# Patient Record
Sex: Male | Born: 1995 | Race: Black or African American | Hispanic: No | Marital: Single | Smoking: Current every day smoker
Health system: Southern US, Community
[De-identification: ages and names within clinical notes are randomized; demographics above are authoritative.]

## PROBLEM LIST (undated history)

## (undated) DIAGNOSIS — A749 Chlamydial infection, unspecified: Secondary | ICD-10-CM

---
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
## 2015-01-21 ENCOUNTER — Encounter (HOSPITAL_COMMUNITY): Payer: Self-pay | Admitting: Emergency Medicine

## 2015-01-21 ENCOUNTER — Emergency Department (HOSPITAL_COMMUNITY)
Admission: EM | Admit: 2015-01-21 | Discharge: 2015-01-21 | Disposition: A | Discharge status: Home / Self Care | Payer: Medicaid Other | Attending: Emergency Medicine | Admitting: Emergency Medicine

## 2015-01-21 DIAGNOSIS — Z720 Tobacco use: Secondary | ICD-10-CM | POA: Diagnosis not present

## 2015-01-21 DIAGNOSIS — Z23 Encounter for immunization: Secondary | ICD-10-CM | POA: Diagnosis not present

## 2015-01-21 DIAGNOSIS — S61236A Puncture wound without foreign body of right little finger without damage to nail, initial encounter: Secondary | ICD-10-CM | POA: Diagnosis present

## 2015-01-21 DIAGNOSIS — T148XXA Other injury of unspecified body region, initial encounter: Secondary | ICD-10-CM

## 2015-01-21 DIAGNOSIS — X58XXXA Exposure to other specified factors, initial encounter: Secondary | ICD-10-CM | POA: Diagnosis not present

## 2015-01-21 DIAGNOSIS — Y9289 Other specified places as the place of occurrence of the external cause: Secondary | ICD-10-CM | POA: Diagnosis not present

## 2015-01-21 DIAGNOSIS — Y9389 Activity, other specified: Secondary | ICD-10-CM | POA: Diagnosis not present

## 2015-01-21 DIAGNOSIS — Z8619 Personal history of other infectious and parasitic diseases: Secondary | ICD-10-CM | POA: Insufficient documentation

## 2015-01-21 DIAGNOSIS — Y998 Other external cause status: Secondary | ICD-10-CM | POA: Insufficient documentation

## 2015-01-21 MED ORDER — NAPROXEN 500 MG PO TABS: 500.0000 mg | ORAL_TABLET | Freq: Two times a day (BID) | ORAL | Status: DC

## 2015-01-21 MED ORDER — SULFAMETHOXAZOLE-TRIMETHOPRIM 800-160 MG PO TABS
1.0000 | ORAL_TABLET | Freq: Once | ORAL | Status: AC
  Administered 2015-01-21: 1 via ORAL
  Filled 2015-01-21: qty 1

## 2015-01-21 MED ORDER — IBUPROFEN 800 MG PO TABS
800.0000 mg | ORAL_TABLET | Freq: Once | ORAL | Status: AC
  Administered 2015-01-21: 800 mg via ORAL
  Filled 2015-01-21: qty 1

## 2015-01-21 MED ORDER — SULFAMETHOXAZOLE-TRIMETHOPRIM 800-160 MG PO TABS: 1.0000 | ORAL_TABLET | Freq: Two times a day (BID) | ORAL | Status: AC

## 2015-01-21 MED ORDER — TETANUS-DIPHTH-ACELL PERTUSSIS 5-2.5-18.5 LF-MCG/0.5 IM SUSP
0.5000 mL | Freq: Once | INTRAMUSCULAR | Status: AC
  Administered 2015-01-21: 0.5 mL via INTRAMUSCULAR
  Filled 2015-01-21: qty 0.5

## 2015-01-21 NOTE — ED Notes (Signed)
Pt. reports insect bite at right distal 5th finger 2 days ago with pain and mild swelling.

## 2015-01-21 NOTE — ED Provider Notes (Signed)
CSN: [id number]     Arrival date & time 01/21/15  0209 History   First MD Initiated Contact with Patient 01/21/15 [phone]     Chief Complaint  Patient presents with  . Insect Bite     (Consider location/radiation/quality/duration/timing/severity/associated sxs/prior Treatment) Patient is a 19 y.o. male presenting with hand pain. The history is provided by the patient.  Hand Pain This is a new problem. The current episode started more than 2 days ago. The problem occurs constantly. The problem has not changed since onset.Pertinent negatives include no chest pain, no abdominal pain, no headaches and no shortness of breath. Nothing aggravates the symptoms. Nothing relieves the symptoms. He has tried nothing for the symptoms. The treatment provided no relief.  Thought it was a spider bite to the right pinky finger as slight red with what looks like a puncture.  No f/c/r.    Past Medical History  Diagnosis Date  . Chlamydia    History reviewed. No pertinent past surgical history. History reviewed. No pertinent family history. History  Substance Use Topics  . Smoking status: Current Every Day Smoker    Types: Cigars  . Smokeless tobacco: Not on file  . Alcohol Use: Yes     Comment: occassional    Review of Systems  Constitutional: Negative for fever.  Respiratory: Negative for shortness of breath.   Cardiovascular: Negative for chest pain.  Gastrointestinal: Negative for abdominal pain.  Neurological: Negative for headaches.  All other systems reviewed and are negative.     Allergies  Review of patient's allergies indicates no known allergies.  Home Medications   Prior to Admission medications   Medication Sig Start Date End Date Taking? Authorizing Provider  HYDROcodone-acetaminophen (NORCO/VICODIN) 5-325 MG per tablet Take 2 tablets by mouth every 4 (four) hours as needed. 06/17/13   Marisa Severin, MD  ibuprofen (ADVIL,MOTRIN) 600 MG tablet Take 1 tablet (600 mg total) by mouth  every 6 (six) hours as needed for pain. 10/04/12   Renne Crigler, PA-C   BP 120/80 mmHg  Pulse 57  Temp(Src) 98.2 F (36.8 C) (Oral)  Resp 16  SpO2 100% Physical Exam  Constitutional: He is oriented to person, place, and time. He appears well-developed and well-nourished. No distress.  HENT:  Head: Normocephalic and atraumatic.  Mouth/Throat: Oropharynx is clear and moist.  Eyes: Conjunctivae and EOM are normal. Pupils are equal, round, and reactive to light.  Neck: Normal range of motion. Neck supple.  Cardiovascular: Normal rate and regular rhythm.   Pulmonary/Chest: Effort normal and breath sounds normal. No respiratory distress. He has no wheezes. He has no rales.  Abdominal: Soft. Bowel sounds are normal. There is no tenderness. There is no rebound and no guarding.  Musculoskeletal: Normal range of motion. He exhibits no edema.       Right hand: He exhibits normal range of motion, no bony tenderness, normal capillary refill and no swelling. Normal sensation noted. Normal strength noted.       Hands: Neurological: He is alert and oriented to person, place, and time. He has normal reflexes.  Skin: Skin is warm and dry.  Psychiatric: He has a normal mood and affect.    ED Course  Procedures (including critical care time) Labs Review Labs Reviewed - No data to display  Imaging Review No results found.   EKG Interpretation None      MDM   Final diagnoses:  None   Area cleansed and dressed.  Works around [employer] and metal suspect  puncture than is mildly inflamed.  Will update tetanus, start bactrim and NSAIDS.  Follow up with your PMD.  Return for swelling streaking or any concerns    Nathaniel Henton, MD 01/21/15 0222

## 2016-09-13 ENCOUNTER — Encounter (HOSPITAL_COMMUNITY): Payer: Self-pay | Admitting: Emergency Medicine

## 2016-09-13 ENCOUNTER — Emergency Department (HOSPITAL_COMMUNITY)
Admission: EM | Admit: 2016-09-13 | Discharge: 2016-09-13 | Disposition: A | Discharge status: Home / Self Care | Payer: Medicaid Other | Attending: Emergency Medicine | Admitting: Emergency Medicine

## 2016-09-13 DIAGNOSIS — F17290 Nicotine dependence, other tobacco product, uncomplicated: Secondary | ICD-10-CM | POA: Insufficient documentation

## 2016-09-13 DIAGNOSIS — L0291 Cutaneous abscess, unspecified: Secondary | ICD-10-CM

## 2016-09-13 DIAGNOSIS — L02214 Cutaneous abscess of groin: Secondary | ICD-10-CM | POA: Insufficient documentation

## 2016-09-13 MED ORDER — NAPROXEN 500 MG PO TABS: 500.0000 mg | ORAL_TABLET | Freq: Two times a day (BID) | ORAL | 0 refills | Status: DC

## 2016-09-13 MED ORDER — DOXYCYCLINE HYCLATE 100 MG PO CAPS: 100.0000 mg | ORAL_CAPSULE | Freq: Two times a day (BID) | ORAL | 0 refills | Status: DC

## 2016-09-13 NOTE — ED Provider Notes (Signed)
[facility] Provider Note    By signing my name below, I, Earmon Phoenix, attest that this documentation has been prepared under the direction and in the presence of [facility], [place]. Electronically Signed: Earmon Phoenix, ED Scribe. 09/13/16. 4:52 PM.   History   Chief Complaint Chief Complaint  Patient presents with  . Abscess    The history is provided by the patient and medical records. No language interpreter was used.    Nathaniel Peterson is a 21 y.o. male who presents to the Emergency Department complaining of a draining abscess to the upper right medial thigh in the groin area that appeared yesterday. He reports associated drainage that began right before coming here. He has not taken anything for pain relief. Palpating the area increases the pain. He denies alleviating factors. He denies fever, chills, nausea, vomiting, warmth or red streaking of the area, scrotal pain.   Past Medical History:  Diagnosis Date  . Chlamydia     There are no active problems to display for this patient.   History reviewed. No pertinent surgical history.     Home Medications    Prior to Admission medications   Medication Sig Start Date End Date Taking? Authorizing Provider  doxycycline (VIBRAMYCIN) 100 MG capsule Take 1 capsule (100 mg total) by mouth 2 (two) times daily. 09/13/16   Hope Orlene Och, NP  naproxen (NAPROSYN) 500 MG tablet Take 1 tablet (500 mg total) by mouth 2 (two) times daily. 09/13/16   Hope Orlene Och, NP    Family History History reviewed. No pertinent family history.  Social History Social History  Substance Use Topics  . Smoking status: Current Every Day Smoker    Types: Cigars  . Smokeless tobacco: Not on file  . Alcohol use Yes     Comment: occassional     Allergies   Patient has no known allergies.   Review of Systems Review of Systems  Constitutional: Negative for chills and fever.  Gastrointestinal: Negative for abdominal pain, nausea  and vomiting.  Genitourinary: Negative for difficulty urinating and dysuria.  Skin: Positive for color change (abscess to upper right groin) and wound.     Physical Exam Updated Vital Signs BP 131/67 (BP Location: Right Arm)   Pulse 71   Temp 98.5 F (36.9 C) (Oral)   Resp 18   SpO2 100%   Physical Exam  Constitutional: He appears well-developed and well-nourished. No distress.  HENT:  Head: Normocephalic.  Eyes: EOM are normal.  Neck: Neck supple.  Cardiovascular: Normal rate.   Pulmonary/Chest: Effort normal.  Abdominal: Soft. There is no tenderness.  Musculoskeletal: Normal range of motion.  Neurological: He is alert.  Skin: Skin is warm and dry.  Draining abscess to the right pubic area beside scrotum. Copious amount of purulent drainage noted. Tender on exam.  Psychiatric: He has a normal mood and affect. His behavior is normal.  Nursing note and vitals reviewed.    ED Treatments / Results  DIAGNOSTIC STUDIES: Oxygen Saturation is 100% on RA, normal by my interpretation.   COORDINATION OF CARE: 4:33 PM- Advised pt that an incision and drainage could be performed to make a larger area for drainage or antibiotics could be prescribed and he could apply warm wet compresses to assist with drainage. Pt requested antibiotics and declined I & D. Pt verbalizes understanding and agrees to plan.  Medications - No data to display  Radiology No results found.  Procedures Procedures (including critical care time)  Medications Ordered  in ED Medications - No data to display   Initial Impression / Assessment and Plan / ED Course  I have reviewed the triage vital signs and the nursing notes.  Patient with skin abscess. Incision and drainage was not performed in the ED today due to area already draining and  pt declining procedure. Will prescribe Doxycycline. Supportive care and return precautions discussed. The patient appears reasonably screened and/or stabilized for  discharge and I doubt any other emergent medical condition requiring further screening, evaluation, or treatment in the ED prior to discharge.  I personally performed the services described in this documentation, which was scribed in my presence. The recorded information has been reviewed and is accurate.   Final Clinical Impressions(s) / ED Diagnoses   Final diagnoses:  Abscess    New Prescriptions Discharge Medication List as of 09/13/2016  4:38 PM    START taking these medications   Details  doxycycline (VIBRAMYCIN) 100 MG capsule Take 1 capsule (100 mg total) by mouth 2 (two) times daily., Starting Tue 09/13/2016, [address]         Hope [place] AcresM Neese, NP 09/17/16 [id number]    Alvira MondayErin Schlossman, MD 09/19/16 [phone]

## 2016-09-13 NOTE — ED Notes (Signed)
Pt state she understands instructioons. Home stable with steady gait.

## 2016-09-13 NOTE — ED Triage Notes (Signed)
Pt sts abscess to upper right leg that is draining at present

## 2016-09-13 NOTE — Discharge Instructions (Signed)
Take the medication as directed. Apply warm wet compress to the area or sit in warm tubs of water. Return if symptoms worsen.

## 2016-12-25 ENCOUNTER — Encounter (HOSPITAL_COMMUNITY): Payer: Self-pay | Admitting: Emergency Medicine

## 2016-12-25 ENCOUNTER — Ambulatory Visit (HOSPITAL_COMMUNITY)
Admission: EM | Admit: 2016-12-25 | Discharge: 2016-12-25 | Disposition: A | Discharge status: Home / Self Care | Payer: Medicaid Other | Attending: Family Medicine | Admitting: Family Medicine

## 2016-12-25 DIAGNOSIS — H6002 Abscess of left external ear: Secondary | ICD-10-CM

## 2016-12-25 MED ORDER — DOXYCYCLINE HYCLATE 100 MG PO TABS: 100.0000 mg | ORAL_TABLET | Freq: Two times a day (BID) | ORAL | 0 refills | Status: DC

## 2016-12-25 MED ORDER — HYDROCODONE-ACETAMINOPHEN 5-325 MG PO TABS: 1.0000 | ORAL_TABLET | Freq: Four times a day (QID) | ORAL | 0 refills | Status: DC | PRN

## 2016-12-25 NOTE — ED Triage Notes (Signed)
The patient presented to the [facility] with a complaint of left side ear pain x 2 days. 

## 2016-12-25 NOTE — ED Provider Notes (Signed)
[facility]    CSN: [id number] Arrival date & time: 12/25/16  1728     History   Chief Complaint Chief Complaint  Patient presents with  . Otalgia    HPI Nathaniel Peterson is a 21 y.o. male.   The patient presented to the [facility] with a complaint of left side ear pain x 2 days.  He's noted a chronic swelling behind the lobe of the left ear following a piercing years ago.      Past Medical History:  Diagnosis Date  . Chlamydia     There are no active problems to display for this patient.   History reviewed. No pertinent surgical history.     Home Medications    Prior to Admission medications   Medication Sig Start Date End Date Taking? Authorizing Provider  doxycycline (VIBRA-TABS) 100 MG tablet Take 1 tablet (100 mg total) by mouth 2 (two) times daily. 12/25/16   Elvina Sidle, MD  HYDROcodone-acetaminophen (NORCO) 5-325 MG tablet Take 1 tablet by mouth every 6 (six) hours as needed for moderate pain. 12/25/16   Elvina Sidle, MD    Family History History reviewed. No pertinent family history.  Social History Social History  Substance Use Topics  . Smoking status: Current Every Day Smoker    Types: Cigars  . Smokeless tobacco: Not on file  . Alcohol use Yes     Comment: occassional     Allergies   Patient has no known allergies.   Review of Systems Review of Systems  HENT: Positive for ear pain.   All other systems reviewed and are negative.    Physical Exam Triage Vital Signs ED Triage Vitals [12/25/16 1752]  Enc Vitals Group     BP (!) 131/59     Pulse Rate (!) 55     Resp 16     Temp 98.4 F (36.9 C)     Temp Source Oral     SpO2 100 %     Weight      Height      Head Circumference      Peak Flow      Pain Score 7     Pain Loc      Pain Edu?      Excl. in GC?    No data found.   Updated Vital Signs BP (!) 131/59 (BP Location: Right Arm)   Pulse (!) 55   Temp 98.4 F (36.9 C) (Oral)   Resp 16   SpO2 100%    Visual Acuity Right Eye Distance:   Left Eye Distance:   Bilateral Distance:    Right Eye Near:   Left Eye Near:    Bilateral Near:     Physical Exam  Constitutional: He is oriented to person, place, and time. He appears well-developed and well-nourished.  HENT:  Left external ear has a 1 cm abscess on the posterior aspect of the lobe.  Eyes: Conjunctivae are normal. Pupils are equal, round, and reactive to light.  Neck: Normal range of motion. Neck supple.  Pulmonary/Chest: Effort normal.  Musculoskeletal: Normal range of motion.  Neurological: He is alert and oriented to person, place, and time.  Skin: Skin is warm and dry.  Nursing note and vitals reviewed.    UC Treatments / Results  Labs (all labs ordered are listed, but only abnormal results are displayed) Labs Reviewed - No data to display  EKG  EKG Interpretation None       Radiology No  results found.  Procedures .Marland Kitchen.Incision and Drainage Date/Time: 12/25/2016 7:12 PM Performed by: Elvina SidleLAUENSTEIN, Davanta Meuser Authorized by: Elvina SidleLAUENSTEIN, Taiki Buckwalter   Consent:    Consent obtained:  Verbal   Consent given by:  Patient   Risks discussed:  Incomplete drainage   Alternatives discussed:  No treatment Location:    Type:  Abscess   Location:  Head   Head location:  L external ear Pre-procedure details:    Skin preparation:  Betadine Anesthesia (see MAR for exact dosages):    Anesthesia method:  Local infiltration   Local anesthetic:  Lidocaine 2% w/o epi Procedure type:    Complexity:  Simple Procedure details:    Needle aspiration: no     Incision types:  Stab incision   Incision depth:  Dermal   Scalpel blade:  15   Wound management:  Probed and deloculated   Drainage:  Purulent   Drainage amount:  Copious   Wound treatment:  Wound left open   Packing materials:  None Post-procedure details:    Patient tolerance of procedure:  Tolerated well, no immediate complications   (including critical care  time)  Medications Ordered in UC Medications - No data to display   Initial Impression / Assessment and Plan / UC Course  I have reviewed the triage vital signs and the nursing notes.  Pertinent labs & imaging results that were available during my care of the patient were reviewed by me and considered in my medical decision making (see chart for details).     Final Clinical Impressions(s) / UC Diagnoses   Final diagnoses:  Abscess of left earlobe    New Prescriptions New Prescriptions   DOXYCYCLINE (VIBRA-TABS) 100 MG TABLET    Take 1 tablet (100 mg total) by mouth 2 (two) times daily.   HYDROCODONE-ACETAMINOPHEN (NORCO) 5-325 MG TABLET    Take 1 tablet by mouth every 6 (six) hours as needed for moderate pain.     Elvina SidleLauenstein, Antoine Vandermeulen, MD 12/25/16 [phone]

## 2017-06-20 ENCOUNTER — Other Ambulatory Visit: Payer: Self-pay

## 2017-06-20 ENCOUNTER — Encounter (HOSPITAL_COMMUNITY): Payer: Self-pay | Admitting: Emergency Medicine

## 2017-06-20 DIAGNOSIS — H6001 Abscess of right external ear: Secondary | ICD-10-CM | POA: Insufficient documentation

## 2017-06-20 DIAGNOSIS — F17210 Nicotine dependence, cigarettes, uncomplicated: Secondary | ICD-10-CM | POA: Insufficient documentation

## 2017-06-20 DIAGNOSIS — L02214 Cutaneous abscess of groin: Secondary | ICD-10-CM | POA: Insufficient documentation

## 2017-06-20 DIAGNOSIS — Z79899 Other long term (current) drug therapy: Secondary | ICD-10-CM | POA: Insufficient documentation

## 2017-06-20 NOTE — ED Triage Notes (Signed)
Pt presents to ED for a developing abscess to his left groin and also on his right ear lobe.  Pt states both have been swelling and becoming more painful for 2 days now.

## 2017-06-21 ENCOUNTER — Emergency Department (HOSPITAL_COMMUNITY)
Admission: EM | Admit: 2017-06-21 | Discharge: 2017-06-21 | Disposition: A | Discharge status: Home / Self Care | Payer: Self-pay | Attending: Emergency Medicine | Admitting: Emergency Medicine

## 2017-06-21 DIAGNOSIS — L0291 Cutaneous abscess, unspecified: Secondary | ICD-10-CM

## 2017-06-21 MED ORDER — BACITRACIN ZINC 500 UNIT/GM EX OINT
TOPICAL_OINTMENT | Freq: Once | CUTANEOUS | Status: AC
  Administered 2017-06-21: 06:00:00 via TOPICAL

## 2017-06-21 MED ORDER — LIDOCAINE-EPINEPHRINE (PF) 2 %-1:200000 IJ SOLN
20.0000 mL | Freq: Once | INTRAMUSCULAR | Status: AC
  Administered 2017-06-21: 20 mL
  Filled 2017-06-21: qty 20

## 2017-06-21 MED ORDER — CEPHALEXIN 500 MG PO CAPS: 500.0000 mg | ORAL_CAPSULE | Freq: Four times a day (QID) | ORAL | 0 refills | Status: DC

## 2017-06-21 MED ORDER — SULFAMETHOXAZOLE-TRIMETHOPRIM 800-160 MG PO TABS: 1.0000 | ORAL_TABLET | Freq: Two times a day (BID) | ORAL | 0 refills | Status: AC

## 2017-06-21 NOTE — Discharge Instructions (Signed)
Please take all of your antibiotics until finished!  Ibuprofen and/or Tyelnol as needed for pain.  Follow up with your doctor, an urgent care, or return to ED in two days for wound check. Return to the emergency department if you develop a fever, new or worsening symptoms develop, any additional concerns.   Abscess An abscess (boil or furuncle) is an infected area that contains a collection of pus.   SYMPTOMS Signs and symptoms of an abscess include pain, tenderness, redness, or hardness. You may feel a moveable soft area under your skin. An abscess can occur anywhere in the body.   TREATMENT  A surgical cut (incision) may be made over your abscess to drain the pus. Gauze may be packed into the space or a drain may be looped through the abscess cavity (pocket). This provides a drain that will allow the cavity to heal from the inside outwards. The abscess may be painful for a few days, but should feel much better if it was drained.  Your abscess, if seen early, may not have localized and may not have been drained. If not, another appointment may be required if it does not get better on its own or with medications.  HOME CARE INSTRUCTIONS  Keep the skin and clothes clean around your abscess.  If the abscess was drained, you will need to use gauze dressing to collect any draining pus. Dressings will typically need to be changed 3 or more times a day.  The infection may spread by skin contact with others. Avoid skin contact as much as possible.  Practice good hygiene. This includes regular hand washing, cover any draining skin lesions, and do not share personal care items.  SEEK MEDICAL CARE IF:  You develop increased pain, swelling, redness, drainage, or bleeding in the wound site.  You develop signs of generalized infection including muscle aches, chills, fever, or a general ill feeling.  You have an oral temperature above 102 F (38.9 C).  MAKE SURE YOU:  Understand these instructions.    Will watch your condition.  Will get help right away if you are not doing well or get worse.  Document Released: 04/27/2005 Document Revised: 03/30/2011 Document Reviewed: 02/19/2008 [facility] Patient Information 2012 [employer], [place].

## 2017-06-21 NOTE — ED Provider Notes (Signed)
[facility] EMERGENCY DEPARTMENT Provider Note   CSN: [id number] Arrival date & time: 06/20/17  2347     History   Chief Complaint Chief Complaint  Patient presents with  . Abscess    HPI Nathaniel Peterson is a 21 y.o. male.  The history is provided by the patient and medical records. No language interpreter was used.  Abscess   Nathaniel Peterson is a 21 y.o. male who presents to the Emergency Department complaining of possible abscess to the left groin and the right ear lobe. Patient states area of pain and redness to the groin began about two days ago and has been progressively worsening. He actually had an abscess to the same earlobe back in May of this year after getting ear pierced. He states that he bought a cheap ear ring and believes that could have been contributory. Ear lobe healed well, but now pain and swelling has returned to the same site. No drainage from either. No medications or treatments prior to arrival for symptoms. No fever, chills.  Past Medical History:  Diagnosis Date  . Chlamydia     There are no active problems to display for this patient.   History reviewed. No pertinent surgical history.     Home Medications    Prior to Admission medications   Medication Sig Start Date End Date Taking? Authorizing Provider  cephALEXin (KEFLEX) 500 MG capsule Take 1 capsule (500 mg total) by mouth 4 (four) times daily. 06/21/17   Ward, Chase Picket, PA-C  HYDROcodone-acetaminophen (NORCO) 5-325 MG tablet Take 1 tablet by mouth every 6 (six) hours as needed for moderate pain. Patient not taking: Reported on 06/21/2017 12/25/16   Elvina Sidle, MD  sulfamethoxazole-trimethoprim (BACTRIM DS,SEPTRA DS) 800-160 MG tablet Take 1 tablet by mouth 2 (two) times daily for 7 days. 06/21/17 06/28/17  Ward, Chase Picket, PA-C    Family History History reviewed. No pertinent family history.  Social History Social History   Tobacco Use  . Smoking  status: Current Every Day Smoker    Types: Cigars  . Smokeless tobacco: Never Used  Substance Use Topics  . Alcohol use: Yes    Comment: occassional  . Drug use: Not on file     Allergies   Patient has no known allergies.   Review of Systems Review of Systems  Skin: Positive for wound.  All other systems reviewed and are negative.    Physical Exam Updated Vital Signs BP 138/75 (BP Location: Right Arm)   Pulse (!) 57   Temp 98.9 F (37.2 C) (Oral)   Resp 12   Ht 5\' 11"  (1.803 m)   Wt 81.6 kg (180 lb)   SpO2 100%   BMI 25.10 kg/m   Physical Exam  Constitutional: He appears well-developed and well-nourished. No distress.  HENT:  Head: Normocephalic and atraumatic.  Neck: Neck supple.  Cardiovascular: Normal rate, regular rhythm and normal heart sounds.  No murmur heard. Pulmonary/Chest: Effort normal and breath sounds normal. No respiratory distress. He has no wheezes. He has no rales.  Musculoskeletal: Normal range of motion.  Neurological: He is alert.  Skin: Skin is warm and dry.  Left groin with 2x3 area of fluctuance and surrounding erythema. + left inguinal lymphadenopathy. Right ear lobe with 1cm area of fluctuance without erythema. Neither draining.  Nursing note and vitals reviewed.    ED Treatments / Results  Labs (all labs ordered are listed, but only abnormal results are displayed) Labs Reviewed - No data  to display  EKG  EKG Interpretation None       Radiology No results found.  Procedures .Marland Kitchen.Incision and Drainage Date/Time: 06/21/2017 6:20 AM Performed by: Ward, Chase PicketJaime Pilcher, PA-C Authorized by: Ward, Chase PicketJaime Pilcher, PA-C   Consent:    Consent obtained:  Verbal   Consent given by:  Patient   Risks discussed:  Bleeding, incomplete drainage, pain and infection Location:    Type:  Abscess   Location:  Lower extremity   Lower extremity location: Left groin. Pre-procedure details:    Skin preparation:  Betadine Anesthesia (see MAR  for exact dosages):    Anesthesia method:  Local infiltration   Local anesthetic:  Lidocaine 2% WITH epi (2 ml) Procedure type:    Complexity:  Complex Procedure details:    Incision types:  Single straight   Scalpel blade:  11   Wound management:  Probed and deloculated   Drainage:  Purulent   Drainage amount:  Copious   Wound treatment:  Wound left open   Packing materials:  None Post-procedure details:    Patient tolerance of procedure:  Tolerated well, no immediate complications .Marland Kitchen.Incision and Drainage Date/Time: 06/21/2017 6:21 AM Performed by: Ward, Chase PicketJaime Pilcher, PA-C Authorized by: Ward, Chase PicketJaime Pilcher, PA-C   Consent:    Consent obtained:  Verbal   Consent given by:  Patient   Risks discussed:  Bleeding, incomplete drainage, infection and pain Location:    Type:  Abscess   Location:  Head   Head location:  R external ear Pre-procedure details:    Skin preparation:  Betadine Anesthesia (see MAR for exact dosages):    Anesthesia method:  Local infiltration   Local anesthetic:  Lidocaine 2% WITH epi Procedure type:    Complexity:  Simple Procedure details:    Incision types:  Stab incision   Scalpel blade:  11   Wound management:  Probed and deloculated   Drainage:  Purulent   Drainage amount:  Moderate   Wound treatment:  Wound left open Post-procedure details:    Patient tolerance of procedure:  Tolerated well, no immediate complications   (including critical care time)  EMERGENCY DEPARTMENT US SOFT TISSUE INTERPRETATION "Study: Limited Soft Tissue Ultrasound"  INDICATIONS: Soft tissue infection Multiple views of the body part were obtained in real-time with a multi-frequency linear probe  PERFORMED BY: Myself IMAGES ARCHIVED?: Yes SIDE:Left BODY PART:Groin INTERPRETATION:  Abcess present ; Surrounding inguinal lymphadenopathy.    Medications Ordered in ED Medications  lidocaine-EPINEPHrine (XYLOCAINE W/EPI) 2 %-1:200000 (PF) injection 20 mL (20  mLs Infiltration Given by Other 06/21/17 0513)  bacitracin ointment ( Topical Given 06/21/17 0543)     Initial Impression / Assessment and Plan / ED Course  I have reviewed the triage vital signs and the nursing notes.  Pertinent labs & imaging results that were available during my care of the patient were reviewed by me and considered in my medical decision making (see chart for details).     Ledora BottcherChristopher Blagg is a 21 y.o. male who presents to ED for abscess requiring incision and drainage of both right earlobe and left groin. I&D performed per procedure note above. Patient tolerated the procedure well. Groin with surrounding erythema and induration. Will treat with bactrim and keflex. Wound care instructions discussed. Wound check in 2-3 days. Return to ER if concern for spread of infection, increasing pain, fevers or other concerns. All questions answered.   Final Clinical Impressions(s) / ED Diagnoses   Final diagnoses:  Abscess  ED Discharge Orders        Ordered    sulfamethoxazole-trimethoprim (BACTRIM DS,SEPTRA DS) 800-160 MG tablet  2 times daily     06/21/17 0513    cephALEXin (KEFLEX) 500 MG capsule  4 times daily     06/21/17 0513       Ward, Chase PicketJaime Pilcher, PA-C 06/21/17 [id number]    Palumbo, April, MD 06/21/17 0710

## 2017-06-21 NOTE — ED Notes (Signed)
I&D tray and lidocaine at bedside 

## 2017-11-12 ENCOUNTER — Emergency Department (HOSPITAL_COMMUNITY)
Admission: EM | Admit: 2017-11-12 | Discharge: 2017-11-12 | Disposition: A | Discharge status: Home / Self Care | Payer: Self-pay | Attending: Emergency Medicine | Admitting: Emergency Medicine

## 2017-11-12 ENCOUNTER — Encounter (HOSPITAL_COMMUNITY): Payer: Self-pay | Admitting: Emergency Medicine

## 2017-11-12 DIAGNOSIS — F17290 Nicotine dependence, other tobacco product, uncomplicated: Secondary | ICD-10-CM | POA: Insufficient documentation

## 2017-11-12 DIAGNOSIS — N341 Nonspecific urethritis: Secondary | ICD-10-CM | POA: Insufficient documentation

## 2017-11-12 DIAGNOSIS — N342 Other urethritis: Secondary | ICD-10-CM

## 2017-11-12 LAB — URINALYSIS, ROUTINE W REFLEX MICROSCOPIC
Bacteria, UA: NONE SEEN
Bilirubin Urine: NEGATIVE
Glucose, UA: NEGATIVE mg/dL
Hgb urine dipstick: NEGATIVE
Ketones, ur: NEGATIVE mg/dL
NITRITE: NEGATIVE
PH: 5 (ref 5.0–8.0)
Protein, ur: NEGATIVE mg/dL
SPECIFIC GRAVITY, URINE: 1.027 (ref 1.005–1.030)
SQUAMOUS EPITHELIAL / LPF: NONE SEEN

## 2017-11-12 MED ORDER — LIDOCAINE HCL (PF) 1 % IJ SOLN
INTRAMUSCULAR | Status: AC
  Administered 2017-11-12: 2.1 mL
  Filled 2017-11-12: qty 5

## 2017-11-12 MED ORDER — METRONIDAZOLE 500 MG PO TABS
2000.0000 mg | ORAL_TABLET | Freq: Once | ORAL | Status: AC
  Administered 2017-11-12: 2000 mg via ORAL
  Filled 2017-11-12: qty 4

## 2017-11-12 MED ORDER — ONDANSETRON 4 MG PO TBDP
4.0000 mg | ORAL_TABLET | Freq: Once | ORAL | Status: AC
Start: 2017-11-12 — End: 2017-11-12
  Administered 2017-11-12: 4 mg via ORAL
  Filled 2017-11-12: qty 1

## 2017-11-12 MED ORDER — AZITHROMYCIN 250 MG PO TABS
1000.0000 mg | ORAL_TABLET | Freq: Once | ORAL | Status: AC
  Administered 2017-11-12: 1000 mg via ORAL
  Filled 2017-11-12: qty 4

## 2017-11-12 MED ORDER — CEFTRIAXONE SODIUM 250 MG IJ SOLR
250.0000 mg | Freq: Once | INTRAMUSCULAR | Status: AC
  Administered 2017-11-12: 250 mg via INTRAMUSCULAR
  Filled 2017-11-12: qty 250

## 2017-11-12 NOTE — ED Provider Notes (Signed)
[facility] Provider Note   CSN: [id number] Arrival date & time: 11/12/17  0010     History   Chief Complaint Chief Complaint  Patient presents with  . Exposure to STD    HPI2 Nathaniel Peterson is a 22 y.o. male.  22 yo M with a chief complaint of dysuria.  Going on for the past couple days ever since he had a sexual encounter and the condom broke.  Patient just before that was treated for trichomonas.  He denies any penile pain or testicular pain.  Denies rash or lesion.  Denies abdominal pain vomiting fevers or chills.  The history is provided by the patient.  Illness  This is a new problem. The current episode started 2 days ago. The problem occurs constantly. The problem has not changed since onset.Pertinent negatives include no chest pain, no abdominal pain, no headaches and no shortness of breath. Nothing aggravates the symptoms. Nothing relieves the symptoms. He has tried nothing for the symptoms. The treatment provided no relief.    Past Medical History:  Diagnosis Date  . Chlamydia     There are no active problems to display for this patient.   History reviewed. No pertinent surgical history.      Home Medications    Prior to Admission medications   Medication Sig Start Date End Date Taking? Authorizing Provider  cephALEXin (KEFLEX) 500 MG capsule Take 1 capsule (500 mg total) by mouth 4 (four) times daily. 06/21/17   Ward, Chase PicketJaime Pilcher, PA-C  HYDROcodone-acetaminophen (NORCO) 5-325 MG tablet Take 1 tablet by mouth every 6 (six) hours as needed for moderate pain. Patient not taking: Reported on 06/21/2017 12/25/16   Elvina SidleLauenstein, Kurt, MD    Family History No family history on file.  Social History Social History   Tobacco Use  . Smoking status: Current Every Day Smoker    Types: Cigars  . Smokeless tobacco: Never Used  Substance Use Topics  . Alcohol use: Yes    Comment: occassional  . Drug use: Yes    Types:  Marijuana     Allergies   Patient has no known allergies.   Review of Systems Review of Systems  Constitutional: Negative for chills and fever.  HENT: Negative for congestion and facial swelling.   Eyes: Negative for discharge and visual disturbance.  Respiratory: Negative for shortness of breath.   Cardiovascular: Negative for chest pain and palpitations.  Gastrointestinal: Negative for abdominal pain, diarrhea and vomiting.  Genitourinary: Positive for dysuria.  Musculoskeletal: Negative for arthralgias and myalgias.  Skin: Negative for color change and rash.  Neurological: Negative for tremors, syncope and headaches.  Psychiatric/Behavioral: Negative for confusion and dysphoric mood.     Physical Exam Updated Vital Signs BP (!) 144/72   Pulse 69   Temp 98.3 F (36.8 C) (Oral)   Resp 18   SpO2 100%   Physical Exam  Constitutional: He is oriented to person, place, and time. He appears well-developed and well-nourished.  HENT:  Head: Normocephalic and atraumatic.  Eyes: Pupils are equal, round, and reactive to light. EOM are normal.  Neck: Normal range of motion. Neck supple. No JVD present.  Cardiovascular: Normal rate and regular rhythm. Exam reveals no gallop and no friction rub.  No murmur heard. Pulmonary/Chest: No respiratory distress. He has no wheezes.  Abdominal: He exhibits no distension. There is no rebound and no guarding.  Genitourinary: Testes normal. Cremasteric reflex is present. Right testis shows no mass and no  tenderness. Left testis shows no mass and no tenderness. Circumcised. Discharge (purulent) found.  Musculoskeletal: Normal range of motion.  Neurological: He is alert and oriented to person, place, and time.  Skin: No rash noted. No pallor.  Psychiatric: He has a normal mood and affect. His behavior is normal.  Nursing note and vitals reviewed.    ED Treatments / Results  Labs (all labs ordered are listed, but only abnormal results are  displayed) Labs Reviewed  URINALYSIS, ROUTINE W REFLEX MICROSCOPIC - Abnormal; Notable for the following components:      Result Value   APPearance HAZY (*)    Leukocytes, UA LARGE (*)    All other components within normal limits  GC/CHLAMYDIA PROBE AMP ([place]) NOT AT [facility]    EKG None  Radiology No results found.  Procedures Procedures (including critical care time)  Medications Ordered in ED Medications  cefTRIAXone (ROCEPHIN) injection 250 mg (has no administration in time range)  azithromycin (ZITHROMAX) tablet 1,000 mg (has no administration in time range)  metroNIDAZOLE (FLAGYL) tablet 2,000 mg (has no administration in time range)  ondansetron (ZOFRAN-ODT) disintegrating tablet 4 mg (has no administration in time range)     Initial Impression / Assessment and Plan / ED Course  I have reviewed the triage vital signs and the nursing notes.  Pertinent labs & imaging results that were available during my care of the patient were reviewed by me and considered in my medical decision making (see chart for details).     22 yo M with concern for STD, purulent discharge from penis, will treat.  Of note the patient declined testing for HIV or syphilis.  3:27 AM:  I have discussed the diagnosis/risks/treatment options with the patient and believe the pt to be eligible for discharge home to follow-up with PCP. We also discussed returning to the ED immediately if new or worsening sx occur. We discussed the sx which are most concerning (e.g., sudden worsening pain, fever, inability to tolerate by mouth) that necessitate immediate return. Medications administered to the patient during their visit and any new prescriptions provided to the patient are listed below.  Medications given during this visit Medications  cefTRIAXone (ROCEPHIN) injection 250 mg (250 mg Intramuscular Given 11/12/17 0306)  azithromycin (ZITHROMAX) tablet 1,000 mg (1,000 mg Oral Given 11/12/17 0301)    metroNIDAZOLE (FLAGYL) tablet 2,000 mg (2,000 mg Oral Given 11/12/17 0302)  ondansetron (ZOFRAN-ODT) disintegrating tablet 4 mg (4 mg Oral Given 11/12/17 0302)  lidocaine (PF) (XYLOCAINE) 1 % injection (2.1 mLs  Given 11/12/17 0306)     The patient appears reasonably screen and/or stabilized for discharge and I doubt any other medical condition or other [facility] requiring further screening, evaluation, or treatment in the ED at this time prior to discharge.    Final Clinical Impressions(s) / ED Diagnoses   Final diagnoses:  Urethritis    ED Discharge Orders    None       Melene Plan, DO 11/12/17 0327

## 2017-11-12 NOTE — ED Triage Notes (Signed)
Pt presents with c/o of dysuria, pt has recent hx of trich/ pt states he did have sex and condom broke. Denies drainage/ denies hematuria

## 2017-11-13 LAB — GC/CHLAMYDIA PROBE AMP (~~LOC~~) NOT AT ARMC
CHLAMYDIA, DNA PROBE: NEGATIVE
NEISSERIA GONORRHEA: POSITIVE — AB

## 2018-01-31 ENCOUNTER — Emergency Department (HOSPITAL_COMMUNITY)
Admission: EM | Admit: 2018-01-31 | Discharge: 2018-01-31 | Disposition: A | Discharge status: Home / Self Care | Payer: Self-pay | Attending: Emergency Medicine | Admitting: Emergency Medicine

## 2018-01-31 ENCOUNTER — Encounter (HOSPITAL_COMMUNITY): Payer: Self-pay

## 2018-01-31 ENCOUNTER — Other Ambulatory Visit: Payer: Self-pay

## 2018-01-31 ENCOUNTER — Emergency Department (HOSPITAL_COMMUNITY): Payer: Self-pay

## 2018-01-31 DIAGNOSIS — F17210 Nicotine dependence, cigarettes, uncomplicated: Secondary | ICD-10-CM | POA: Insufficient documentation

## 2018-01-31 DIAGNOSIS — R1033 Periumbilical pain: Secondary | ICD-10-CM | POA: Insufficient documentation

## 2018-01-31 LAB — URINALYSIS, ROUTINE W REFLEX MICROSCOPIC
Bilirubin Urine: NEGATIVE
Glucose, UA: NEGATIVE mg/dL
Hgb urine dipstick: NEGATIVE
KETONES UR: NEGATIVE mg/dL
Leukocytes, UA: NEGATIVE
NITRITE: NEGATIVE
PROTEIN: NEGATIVE mg/dL
Specific Gravity, Urine: 1.028 (ref 1.005–1.030)
pH: 8 (ref 5.0–8.0)

## 2018-01-31 LAB — COMPREHENSIVE METABOLIC PANEL
ALK PHOS: 75 U/L (ref 38–126)
ALT: 13 U/L (ref 0–44)
ANION GAP: 11 (ref 5–15)
AST: 24 U/L (ref 15–41)
Albumin: 4.2 g/dL (ref 3.5–5.0)
BILIRUBIN TOTAL: 0.6 mg/dL (ref 0.3–1.2)
BUN: 10 mg/dL (ref 6–20)
CALCIUM: 9.7 mg/dL (ref 8.9–10.3)
CO2: 22 mmol/L (ref 22–32)
Chloride: 107 mmol/L (ref 98–111)
Creatinine, Ser: 1.05 mg/dL (ref 0.61–1.24)
Glucose, Bld: 92 mg/dL (ref 70–99)
Potassium: 4.6 mmol/L (ref 3.5–5.1)
SODIUM: 140 mmol/L (ref 135–145)
TOTAL PROTEIN: 7.6 g/dL (ref 6.5–8.1)

## 2018-01-31 LAB — CBC WITH DIFFERENTIAL/PLATELET
ABS IMMATURE GRANULOCYTES: 0 10*3/uL (ref 0.0–0.1)
BASOS ABS: 0.1 10*3/uL (ref 0.0–0.1)
BASOS PCT: 1 %
EOS ABS: 0.1 10*3/uL (ref 0.0–0.7)
Eosinophils Relative: 2 %
HCT: 43.8 % (ref 39.0–52.0)
Hemoglobin: 14.4 g/dL (ref 13.0–17.0)
IMMATURE GRANULOCYTES: 0 %
Lymphocytes Relative: 43 %
Lymphs Abs: 3 10*3/uL (ref 0.7–4.0)
MCH: 29.1 pg (ref 26.0–34.0)
MCHC: 32.9 g/dL (ref 30.0–36.0)
MCV: 88.5 fL (ref 78.0–100.0)
Monocytes Absolute: 0.4 10*3/uL (ref 0.1–1.0)
Monocytes Relative: 6 %
NEUTROS ABS: 3.4 10*3/uL (ref 1.7–7.7)
NEUTROS PCT: 48 %
PLATELETS: 223 10*3/uL (ref 150–400)
RBC: 4.95 MIL/uL (ref 4.22–5.81)
RDW: 13.5 % (ref 11.5–15.5)
WBC: 7 10*3/uL (ref 4.0–10.5)

## 2018-01-31 LAB — LIPASE, BLOOD: Lipase: 32 U/L (ref 11–51)

## 2018-01-31 MED ORDER — IOHEXOL 300 MG/ML  SOLN
100.0000 mL | Freq: Once | INTRAMUSCULAR | Status: AC | PRN
  Administered 2018-01-31: 100 mL via INTRAVENOUS

## 2018-01-31 MED ORDER — DICYCLOMINE HCL 20 MG PO TABS: 20.0000 mg | ORAL_TABLET | Freq: Two times a day (BID) | ORAL | 0 refills | Status: DC

## 2018-01-31 MED ORDER — SODIUM CHLORIDE 0.9 % IV BOLUS
1000.0000 mL | Freq: Once | INTRAVENOUS | Status: AC
  Administered 2018-01-31: 1000 mL via INTRAVENOUS

## 2018-01-31 MED ORDER — ONDANSETRON 4 MG PO TBDP
4.0000 mg | ORAL_TABLET | Freq: Three times a day (TID) | ORAL | 0 refills | Status: AC | PRN
Start: 2018-01-31 — End: ?

## 2018-01-31 MED ORDER — ONDANSETRON HCL 4 MG/2ML IJ SOLN
4.0000 mg | Freq: Once | INTRAMUSCULAR | Status: AC
  Administered 2018-01-31: 4 mg via INTRAVENOUS
  Filled 2018-01-31: qty 2

## 2018-01-31 MED ORDER — MORPHINE SULFATE (PF) 4 MG/ML IV SOLN
4.0000 mg | Freq: Once | INTRAVENOUS | Status: AC
  Administered 2018-01-31: 4 mg via INTRAVENOUS
  Filled 2018-01-31: qty 1

## 2018-01-31 MED ORDER — HYDROMORPHONE HCL 1 MG/ML IJ SOLN
1.0000 mg | Freq: Once | INTRAMUSCULAR | Status: AC
  Administered 2018-01-31: 1 mg via INTRAVENOUS
  Filled 2018-01-31: qty 1

## 2018-01-31 NOTE — ED Triage Notes (Signed)
Patient complains of being awakened at 0600 with severe abdominal pain. Appears uncomfortable on arrival. No nausea, no vomiting, no diarrhea

## 2018-01-31 NOTE — Discharge Instructions (Signed)
Abdominal pain  Hand washing: Wash your hands throughout the day, but especially before and after touching the face, using the restroom, sneezing, coughing, or touching surfaces that have been coughed or sneezed upon. Hydration: Symptoms will be intensified and complicated by dehydration. Dehydration can also extend the duration of symptoms. Drink plenty of fluids and get plenty of rest. You should be drinking at least half a liter of water an hour to stay hydrated. Electrolyte drinks (ex. Gatorade, Powerade, Pedialyte) are also encouraged. You should be drinking enough fluids to make your urine light yellow, almost clear. If this is not the case, you are not drinking enough water. Please note that some of the treatments indicated below will not be effective if you are not adequately hydrated. Diet: Please concentrate on hydration, however, you may introduce food slowly.  Start with a clear liquid diet, progressed to a full liquid diet, and then bland solids as you are able. Pain or fever: Ibuprofen, Naproxen, or Tylenol for pain or fever.  Nausea/vomiting: Use the Zofran for nausea or vomiting. Diarrhea: May use medications such as loperamide (Imodium) or Bismuth subsalicylate (Pepto-Bismol). Bentyl: This medication is what is known as an antispasmodic and is intended to help reduce abdominal discomfort. Follow-up: Follow-up with a primary care provider on this matter within a week should symptoms fail to resolve. Return: Return should you develop a fever, bloody diarrhea, increased abdominal pain, uncontrolled vomiting, or any other major concerns.

## 2018-01-31 NOTE — ED Provider Notes (Signed)
[facility]   CSN: [id number] Arrival date & time: 01/31/18  [id number]     History   Chief Complaint Chief Complaint  Patient presents with  . Abdominal Pain    HPI Nathaniel Peterson is a 22 y.o. male.  HPI   Nathaniel Peterson is a 22 y.o. male, patient with no pertinent past medical history, presenting to the ED with abdominal pain waking the patient at 6 AM this morning.  Pain is periumbilical, stabbing, fairly constant, 10/10, occasionally radiating to the left flank.  Has not felt this pain before.  Had 2 bowel movements this morning which were normal.  Denies fever/chills, N/V/C/D, chest pain, shortness of breath, difficulty urinating, hematuria, dysuria, penile discharge, testicular pain, or any other complaints.    Past Medical History:  Diagnosis Date  . Chlamydia     There are no active problems to display for this patient.   History reviewed. No pertinent surgical history.      Home Medications    Prior to Admission medications   Medication Sig Start Date End Date Taking? Authorizing Provider  cephALEXin (KEFLEX) 500 MG capsule Take 1 capsule (500 mg total) by mouth 4 (four) times daily. 06/21/17   Ward, Chase PicketJaime Pilcher, PA-C  dicyclomine (BENTYL) 20 MG tablet Take 1 tablet (20 mg total) by mouth 2 (two) times daily. 01/31/18   Joy, Shawn C, PA-C  HYDROcodone-acetaminophen (NORCO) 5-325 MG tablet Take 1 tablet by mouth every 6 (six) hours as needed for moderate pain. Patient not taking: Reported on 06/21/2017 12/25/16   Elvina SidleLauenstein, Kurt, MD  ondansetron (ZOFRAN ODT) 4 MG disintegrating tablet Take 1 tablet (4 mg total) by mouth every 8 (eight) hours as needed for nausea or vomiting. 01/31/18   Joy, Hillard DankerShawn C, PA-C    Family History No family history on file.  Social History Social History   Tobacco Use  . Smoking status: Current Every Day Smoker    Types: Cigars  . Smokeless tobacco: Never Used  Substance Use  Topics  . Alcohol use: Yes    Comment: occassional  . Drug use: Yes    Types: Marijuana     Allergies   Patient has no known allergies.   Review of Systems Review of Systems  Constitutional: Negative for chills and fever.  Respiratory: Negative for shortness of breath.   Cardiovascular: Negative for chest pain.  Gastrointestinal: Positive for abdominal pain. Negative for blood in stool, constipation, diarrhea, nausea and vomiting.  Genitourinary: Negative for discharge, dysuria, hematuria, penile swelling, scrotal swelling and testicular pain.  All other systems reviewed and are negative.    Physical Exam Updated Vital Signs BP (!) 125/56   Pulse 71   Temp 97.6 F (36.4 C) (Oral)   Resp (!) 22   SpO2 100%   Physical Exam  Constitutional: He appears well-developed and well-nourished. No distress.  HENT:  Head: Normocephalic and atraumatic.  Eyes: Conjunctivae are normal.  Neck: Neck supple.  Cardiovascular: Normal rate, regular rhythm, normal heart sounds and intact distal pulses.  Pulmonary/Chest: Effort normal and breath sounds normal. No respiratory distress.  Abdominal: Soft. There is tenderness in the periumbilical area. There is no guarding.    Genitourinary:  Genitourinary Comments: Penis, scrotum, and testicles without swelling, lesions, or tenderness. No penile discharge.  No inguinal hernia noted. Cremasteric reflex intact. RN, Seward GraterMaggie, served as chaperone during the exam.   Musculoskeletal: He exhibits no edema.  Lymphadenopathy:    He has no cervical  adenopathy. Inguinal adenopathy noted on the right and left side.  Neurological: He is alert.  Skin: Skin is warm and dry. He is not diaphoretic.  Psychiatric: He has a normal mood and affect. His behavior is normal.  Nursing note and vitals reviewed.    ED Treatments / Results  Labs (all labs ordered are listed, but only abnormal results are displayed) Labs Reviewed  URINALYSIS, ROUTINE W REFLEX  MICROSCOPIC - Abnormal; Notable for the following components:      Result Value   APPearance HAZY (*)    All other components within normal limits  LIPASE, BLOOD  COMPREHENSIVE METABOLIC PANEL  CBC WITH DIFFERENTIAL/PLATELET    EKG None  Radiology Ct Abdomen Pelvis W Contrast  Result Date: 01/31/2018 CLINICAL DATA:  Acute onset lower abdominal pain at 6:30 this morning. EXAM: CT ABDOMEN AND PELVIS WITH CONTRAST TECHNIQUE: Multidetector CT imaging of the abdomen and pelvis was performed using the standard protocol following bolus administration of intravenous contrast. CONTRAST:  100 ml OMNIPAQUE IOHEXOL 300 MG/ML  SOLN COMPARISON:  None. FINDINGS: Lower chest: Lung bases are clear. No pleural or pericardial effusion. Hepatobiliary: No focal liver abnormality is seen. No gallstones, gallbladder wall thickening, or biliary dilatation. Pancreas: Unremarkable. No pancreatic ductal dilatation or surrounding inflammatory changes. Spleen: Normal in size without focal abnormality. Adrenals/Urinary Tract: Adrenal glands are unremarkable. Kidneys are normal, without renal calculi, focal lesion, or hydronephrosis. Bladder is unremarkable. Stomach/Bowel: Stomach is within normal limits. Appendix appears normal. No evidence of bowel wall thickening, distention, or inflammatory changes. Vascular/Lymphatic: No significant vascular findings are present. There are fairly prominent inguinal lymph nodes bilaterally measuring up to 1.8 cm on the left on image 77. No lymphadenopathy within the abdomen or pelvis. Reproductive: Prostate is unremarkable. Other: No abdominal wall hernia or abnormality. No abdominopelvic ascites. Musculoskeletal: Negative. IMPRESSION: Somewhat prominent inguinal lymph nodes bilaterally are likely incidental but could be due to some lower extremity inflammatory process. The examination is otherwise negative. Electronically Signed   By: Drusilla Kanner M.D.   On: 01/31/2018 11:44     Procedures Procedures (including critical care time)  Medications Ordered in ED Medications  morphine 4 MG/ML injection 4 mg (4 mg Intravenous Given 01/31/18 0837)  ondansetron (ZOFRAN) injection 4 mg (4 mg Intravenous Given 01/31/18 0838)  sodium chloride 0.9 % bolus 1,000 mL (0 mLs Intravenous Stopped 01/31/18 1039)  HYDROmorphone (DILAUDID) injection 1 mg (1 mg Intravenous Given 01/31/18 0903)  iohexol (OMNIPAQUE) 300 MG/ML solution 100 mL (100 mLs Intravenous Contrast Given 01/31/18 1044)     Initial Impression / Assessment and Plan / ED Course  I have reviewed the triage vital signs and the nursing notes.  Pertinent labs & imaging results that were available during my care of the patient were reviewed by me and considered in my medical decision making (see chart for details).  Clinical Course as of Jan 31 1545  Wed Jan 31, 2018  1207 Patient's pain is well controlled. Resting comfortably on the bed.   [SJ]    Clinical Course User Index [SJ] Joy, Shawn C, PA-C      Patient presents with abdominal pain without other symptoms.  Pain controlled in ED and did not recur.  Inguinal lymphadenopathy noted on CT, but otherwise without acute abnormalities.  Lab work reassuring. The patient was given instructions for home care as well as return precautions. Patient voices understanding of these instructions, accepts the plan, and is comfortable with discharge.   Final Clinical Impressions(s) / ED  Diagnoses   Final diagnoses:  Periumbilical abdominal pain    ED Discharge Orders        Ordered    dicyclomine (BENTYL) 20 MG tablet  2 times daily     01/31/18 1253    ondansetron (ZOFRAN ODT) 4 MG disintegrating tablet  Every 8 hours PRN     01/31/18 1253       Anselm Pancoast, PA-C 01/31/18 1547    Gwyneth Sprout, MD 02/01/18 [phone]

## 2018-01-31 NOTE — ED Notes (Signed)
Patient transported to CT 

## 2018-03-27 ENCOUNTER — Ambulatory Visit (HOSPITAL_COMMUNITY)
Admission: EM | Admit: 2018-03-27 | Discharge: 2018-03-27 | Disposition: A | Discharge status: Home / Self Care | Payer: Self-pay | Attending: Family Medicine | Admitting: Family Medicine

## 2018-03-27 ENCOUNTER — Encounter (HOSPITAL_COMMUNITY): Payer: Self-pay | Admitting: Emergency Medicine

## 2018-03-27 DIAGNOSIS — Z113 Encounter for screening for infections with a predominantly sexual mode of transmission: Secondary | ICD-10-CM | POA: Insufficient documentation

## 2018-03-27 DIAGNOSIS — Z202 Contact with and (suspected) exposure to infections with a predominantly sexual mode of transmission: Secondary | ICD-10-CM

## 2018-03-27 DIAGNOSIS — F17290 Nicotine dependence, other tobacco product, uncomplicated: Secondary | ICD-10-CM | POA: Insufficient documentation

## 2018-03-27 DIAGNOSIS — Z8619 Personal history of other infectious and parasitic diseases: Secondary | ICD-10-CM | POA: Insufficient documentation

## 2018-03-27 NOTE — ED Triage Notes (Signed)
Pt here for STD testing; denies sx  

## 2018-03-27 NOTE — Discharge Instructions (Signed)
We have sent testing for sexually transmitted infections. We will notify you of any positive results once they are received. If required, we will prescribe any medications you might need.  Please refrain from all sexual activity for at least the next seven days.  

## 2018-03-28 LAB — URINE CYTOLOGY ANCILLARY ONLY
Chlamydia: NEGATIVE
Neisseria Gonorrhea: NEGATIVE
TRICH (WINDOWPATH): NEGATIVE

## 2018-03-28 NOTE — ED Provider Notes (Signed)
[facility]   [id number] 03/27/18 Arrival Time: 1114  ASSESSMENT & PLAN:  1. Screen for STD (sexually transmitted disease)    No empiric treatment.   Discharge Instructions     We have sent testing for sexually transmitted infections. We will notify you of any positive results once they are received. If required, we will prescribe any medications you might need.  Please refrain from all sexual activity for at least the next seven days.     Pending: Labs Reviewed  URINE CYTOLOGY ANCILLARY ONLY    Will notify of any positive results. Instructed to refrain from sexual activity for at least seven days.  Reviewed expectations re: course of current medical issues. Questions answered. Outlined signs and symptoms indicating need for more acute intervention. Patient verbalized understanding. After Visit Summary given.   SUBJECTIVE:  Nathaniel BottcherChristopher Peterson is a 22 y.o. male who desires STD testing. No symptoms. Afebrile. No abdominal or pelvic pain. No n/v. No rashes or lesions. Sexually active with single male partner. H/O treated chlamydia in the past.  ROS: As per HPI.  OBJECTIVE:  Vitals:   03/27/18 1122  BP: 122/82  Pulse: 69  Resp: 16  Temp: 97.9 F (36.6 C)  TempSrc: Oral  SpO2: 100%    General appearance: alert, cooperative, appears stated age and no distress Throat: lips, mucosa, and tongue normal; teeth and gums normal Back: no CVA tenderness Abdomen: soft, non-tender Skin: warm and dry Psychological: alert and cooperative. Normal mood and affect.    Labs Reviewed  URINE CYTOLOGY ANCILLARY ONLY    No Known Allergies  Past Medical History:  Diagnosis Date  . Chlamydia    History reviewed. No pertinent family history. Social History   Socioeconomic History  . Marital status: Single    Spouse name: Not on file  . Number of children: Not on file  . Years of education: Not on file  . Highest education level: Not on file  Occupational  History  . Not on file  Social Needs  . Financial resource strain: Not on file  . Food insecurity:    Worry: Not on file    Inability: Not on file  . Transportation needs:    Medical: Not on file    Non-medical: Not on file  Tobacco Use  . Smoking status: Current Every Day Smoker    Types: Cigars  . Smokeless tobacco: Never Used  Substance and Sexual Activity  . Alcohol use: Yes    Comment: occassional  . Drug use: Yes    Types: Marijuana  . Sexual activity: Yes    Birth control/protection: None  Lifestyle  . Physical activity:    Days per week: Not on file    Minutes per session: Not on file  . Stress: Not on file  Relationships  . Social connections:    Talks on phone: Not on file    Gets together: Not on file    Attends religious service: Not on file    Active member of club or organization: Not on file    Attends meetings of clubs or organizations: Not on file    Relationship status: Not on file  . Intimate partner violence:    Fear of current or ex partner: Not on file    Emotionally abused: Not on file    Physically abused: Not on file    Forced sexual activity: Not on file  Other Topics Concern  . Not on file  Social History Narrative   **  Merged History Encounter Mardella Layman, MD 03/28/18 [phone]

## 2018-06-22 ENCOUNTER — Telehealth (HOSPITAL_COMMUNITY): Payer: Self-pay

## 2018-06-22 NOTE — Telephone Encounter (Signed)
Pt presented to clinic wanting to know what his test results are. Verified the patients name and DOB. Patient had visitor with him, instructed that I could not give his information to them. Patient requesting me to tell them with them present. Instructed to patient that his STD testing was negative.

## 2018-07-07 ENCOUNTER — Ambulatory Visit (HOSPITAL_COMMUNITY)
Admission: EM | Admit: 2018-07-07 | Discharge: 2018-07-07 | Disposition: A | Discharge status: Home / Self Care | Payer: Self-pay | Attending: Physician Assistant | Admitting: Physician Assistant

## 2018-07-07 ENCOUNTER — Encounter (HOSPITAL_COMMUNITY): Payer: Self-pay

## 2018-07-07 DIAGNOSIS — Z202 Contact with and (suspected) exposure to infections with a predominantly sexual mode of transmission: Secondary | ICD-10-CM

## 2018-07-07 DIAGNOSIS — F17290 Nicotine dependence, other tobacco product, uncomplicated: Secondary | ICD-10-CM | POA: Insufficient documentation

## 2018-07-07 DIAGNOSIS — Z7251 High risk heterosexual behavior: Secondary | ICD-10-CM

## 2018-07-07 MED ORDER — METRONIDAZOLE 500 MG PO TABS
2000.0000 mg | ORAL_TABLET | Freq: Once | ORAL | Status: AC
  Administered 2018-07-07: 2000 mg via ORAL

## 2018-07-07 MED ORDER — METRONIDAZOLE 500 MG PO TABS
ORAL_TABLET | ORAL | Status: AC
  Filled 2018-07-07: qty 4

## 2018-07-07 NOTE — ED Provider Notes (Signed)
[facility]    CSN: [id number] Arrival date & time: 07/07/18  1015     History   Chief Complaint Chief Complaint  Patient presents with  . Exposure to STD    HPI Nathaniel Peterson is a 22 y.o. male.   22 year old male comes in for testing after STD exposure.  States was told by his partner she was tested positive for trichomonas.  He is asymptomatic.  Denies fever, chills, night sweats.  Denies urinary symptoms such as frequency, dysuria, hematuria.  Denies penile discharge, penile lesions, testicular swelling, testicular pain.  He is sexually active with one male partner, no condom use.     Past Medical History:  Diagnosis Date  . Chlamydia     There are no active problems to display for this patient.   History reviewed. No pertinent surgical history.     Home Medications    Prior to Admission medications   Medication Sig Start Date End Date Taking? Authorizing Provider  ondansetron (ZOFRAN ODT) 4 MG disintegrating tablet Take 1 tablet (4 mg total) by mouth every 8 (eight) hours as needed for nausea or vomiting. 01/31/18   Joy, Hillard DankerShawn C, PA-C    Family History History reviewed. No pertinent family history.  Social History Social History   Tobacco Use  . Smoking status: Current Every Day Smoker    Types: Cigars  . Smokeless tobacco: Never Used  Substance Use Topics  . Alcohol use: Yes    Comment: occassional  . Drug use: Yes    Types: Marijuana     Allergies   Patient has no known allergies.   Review of Systems Review of Systems  Reason unable to perform ROS: See HPI as above.     Physical Exam Triage Vital Signs ED Triage Vitals [07/07/18 1100]  Enc Vitals Group     BP 136/79     Pulse Rate (!) 51     Resp      Temp 98.2 F (36.8 C)     Temp Source Oral     SpO2 99 %     Weight      Height      Head Circumference      Peak Flow      Pain Score 0     Pain Loc      Pain Edu?      Excl. in GC?    No data  found.  Updated Vital Signs BP 136/79 (BP Location: Right Arm)   Pulse (!) 51   Temp 98.2 F (36.8 C) (Oral)   SpO2 99%   Visual Acuity Right Eye Distance:   Left Eye Distance:   Bilateral Distance:    Right Eye Near:   Left Eye Near:    Bilateral Near:     Physical Exam  Constitutional: He is oriented to person, place, and time. He appears well-developed and well-nourished. No distress.  HENT:  Head: Normocephalic and atraumatic.  Eyes: Pupils are equal, round, and reactive to light. Conjunctivae are normal.  Neurological: He is alert and oriented to person, place, and time.  Skin: He is not diaphoretic.     UC Treatments / Results  Labs (all labs ordered are listed, but only abnormal results are displayed) Labs Reviewed  URINE CYTOLOGY ANCILLARY ONLY    EKG None  Radiology No results found.  Procedures Procedures (including critical care time)  Medications Ordered in UC Medications  metroNIDAZOLE (FLAGYL) tablet 2,000 mg (2,000 mg Oral Given 07/07/18  1138)    Initial Impression / Assessment and Plan / UC Course  I have reviewed the triage vital signs and the nursing notes.  Pertinent labs & imaging results that were available during my care of the patient were reviewed by me and considered in my medical decision making (see chart for details).    Patient was treated empirically for trichomonas.  Flagyl 2 g given in office today.  Cytology sent, patient will be contacted with any positive results that require additional treatment. Patient to refrain from sexual activity for the next 7 days. Return precautions given.   Final Clinical Impressions(s) / UC Diagnoses   Final diagnoses:  STD exposure    ED Prescriptions    None        Belinda Fisher, PA-C 07/07/18 1140

## 2018-07-07 NOTE — ED Triage Notes (Signed)
Pt present that he was exposed to Trichomoniasis

## 2018-07-07 NOTE — ED Notes (Signed)
Pt was able to provided a urine sample.

## 2018-07-07 NOTE — Discharge Instructions (Signed)
You were treated empirically for trichomonas.  Flagyl 2 g given in office today.  Cytology sent, you will be contacted with any positive results that requires further treatment. Refrain from sexual activity and alcohol use for the next 7 days.  Monitor for any penile lesion/sore, testicular swelling or testicular pain, follow-up for reevaluation.

## 2018-07-07 NOTE — ED Notes (Signed)
Urine in lab 

## 2018-07-09 LAB — URINE CYTOLOGY ANCILLARY ONLY
Chlamydia: NEGATIVE
Neisseria Gonorrhea: NEGATIVE
Trichomonas: NEGATIVE

## 2018-09-15 ENCOUNTER — Encounter (HOSPITAL_COMMUNITY): Payer: Self-pay

## 2018-09-15 ENCOUNTER — Emergency Department (HOSPITAL_COMMUNITY)
Admission: EM | Admit: 2018-09-15 | Discharge: 2018-09-15 | Disposition: A | Discharge status: Home / Self Care | Payer: Self-pay | Attending: Emergency Medicine | Admitting: Emergency Medicine

## 2018-09-15 DIAGNOSIS — F17210 Nicotine dependence, cigarettes, uncomplicated: Secondary | ICD-10-CM | POA: Insufficient documentation

## 2018-09-15 DIAGNOSIS — L0201 Cutaneous abscess of face: Secondary | ICD-10-CM | POA: Insufficient documentation

## 2018-09-15 MED ORDER — DOXYCYCLINE HYCLATE 100 MG PO CAPS: 100.0000 mg | ORAL_CAPSULE | Freq: Two times a day (BID) | ORAL | 0 refills | Status: DC

## 2018-09-15 MED ORDER — LIDOCAINE-EPINEPHRINE (PF) 2 %-1:200000 IJ SOLN
10.0000 mL | Freq: Once | INTRAMUSCULAR | Status: AC
  Administered 2018-09-15: 10 mL
  Filled 2018-09-15: qty 20

## 2018-09-15 NOTE — Discharge Instructions (Signed)
Keep wound clean and dry. Apply warm compresses to affected area throughout the day. Take antibiotic until it is finished. Alternate between tylenol and motrin as needed for pain. Follow-up with [facility]Primary Care doctor in 2-3 days for wound recheck. Monitor area for signs of infection to include, but not limited to: increasing pain, spreading redness, drainage/pus, worsening swelling, or fevers. Return to emergency department for emergent changing or worsening symptoms.

## 2018-09-15 NOTE — ED Triage Notes (Signed)
Onset 9 days bump on right jawline.  Past 3 days pain and swelling worse.  Pain worse when opening mouth, smoking, chewing.  No drainage.  Pain radiates to right eaer.

## 2018-09-15 NOTE — ED Provider Notes (Signed)
[facility]   CSN: [id number] Arrival date & time: 09/15/18  2020     History   Chief Complaint Chief Complaint  Patient presents with  . Abscess    HPI Nathaniel BottcherChristopher Peterson is a 23 y.o. otherwise healthy male who presents to the ED with complaints of right cheek/face swelling/abscess that first occurred 9 days ago but worsened 3 days ago.  Nathaniel Peterson reports having a small swollen area that appeared 9 days ago and gradually got worse, 3 days ago it started getting much more swollen, erythematous, and warm to the touch.  Nathaniel Peterson describes the pain as 8/10 constant throbbing right face/jaw/cheek pain over the swollen area, radiating into the right ear, worse with opening his mouth or chewing, and unrelieved with BC powders.  Nathaniel Peterson has had something like this before on his left earlobe, but never had anything in this area.  Nathaniel Peterson denies any dental pain, gum swelling or drainage, ear drainage, drainage from the swollen area, fevers, chills, trismus, drooling, or any other complaints at this time.  The history is provided by the patient and medical records. No language interpreter was used.    Past Medical History:  Diagnosis Date  . Chlamydia     There are no active problems to display for this patient.   History reviewed. No pertinent surgical history.      Home Medications    Prior to Admission medications   Medication Sig Start Date End Date Taking? Authorizing Provider  ondansetron (ZOFRAN ODT) 4 MG disintegrating tablet Take 1 tablet (4 mg total) by mouth every 8 (eight) hours as needed for nausea or vomiting. 01/31/18   Joy, Hillard DankerShawn C, PA-C    Family History History reviewed. No pertinent family history.  Social History Social History   Tobacco Use  . Smoking status: Current Every Day Smoker    Types: Cigars  . Smokeless tobacco: Never Used  Substance Use Topics  . Alcohol use: Yes    Comment: occassional  . Drug use: Yes    Types:  Marijuana     Allergies   Patient has no known allergies.   Review of Systems Review of Systems  Constitutional: Negative for chills and fever.  HENT: Positive for ear pain and facial swelling (R cheek/jaw abscess). Negative for dental problem, drooling, ear discharge and trouble swallowing.   Musculoskeletal: Positive for myalgias (R cheek/jaw).  Skin: Positive for wound (abscess).  Allergic/Immunologic: Negative for immunocompromised state.     Physical Exam Updated Vital Signs BP (!) 142/79 (BP Location: Left Arm)   Pulse 69   Temp 98.6 F (37 C) (Oral)   Resp 16   SpO2 99%   Physical Exam Vitals signs and nursing note reviewed.  Constitutional:      General: Nathaniel Peterson is not in acute distress.    Appearance: Normal appearance. Nathaniel Peterson is well-developed. Nathaniel Peterson is not toxic-appearing.     Comments: Afebrile, nontoxic, NAD  HENT:     Head: Normocephalic and atraumatic.      Right Ear: Hearing, tympanic membrane, ear canal and external ear normal.     Left Ear: Hearing, tympanic membrane, ear canal and external ear normal.     Mouth/Throat:     Mouth: Mucous membranes are moist.     Dentition: No dental tenderness, dental abscesses or gum lesions.     Comments: ~2cm circular fluctuant abscess to the R cheek/lower jaw, with mild overlying erythema and warmth, mild TTP, no surrounding induration, no  red streaking. Small ingrown hair to the center of this abscess. No dental tenderness, no gum swelling or drainage. No dental abscess. Ears clear bilaterally. Some mild reactive LAD underneath the abscess along the jawline.  Eyes:     General:        Right eye: No discharge.        Left eye: No discharge.     Conjunctiva/sclera: Conjunctivae normal.  Neck:     Musculoskeletal: Normal range of motion and neck supple.  Cardiovascular:     Rate and Rhythm: Normal rate.     Pulses: Normal pulses.  Pulmonary:     Effort: Pulmonary effort is normal. No respiratory distress.  Abdominal:      General: There is no distension.  Musculoskeletal: Normal range of motion.  Skin:    General: Skin is warm and dry.     Findings: No rash.  Neurological:     Mental Status: Nathaniel Peterson is alert and oriented to person, place, and time.     Sensory: Sensation is intact. No sensory deficit.     Motor: Motor function is intact.  Psychiatric:        Mood and Affect: Mood and affect normal.        Behavior: Behavior normal.      ED Treatments / Results  Labs (all labs ordered are listed, but only abnormal results are displayed) Labs Reviewed - No data to display  EKG None  Radiology No results found.  Procedures .Marland KitchenIncision and Drainage Date/Time: 09/15/2018 8:55 PM Performed by: Rhona Raider, PA-C Authorized by: Rhona Raider, [place]   Consent:    Consent obtained:  Verbal   Consent given by:  Patient   Risks discussed:  Bleeding, incomplete drainage and pain   Alternatives discussed:  Alternative treatment Location:    Type:  Abscess   Size:  2cm   Location:  Head   Head location:  Face Anesthesia (see MAR for exact dosages):    Anesthesia method:  Local infiltration   Local anesthetic:  Lidocaine 2% WITH epi Procedure type:    Complexity:  Simple Procedure details:    Needle aspiration: yes     Needle size:  18 G   Drainage:  Purulent   Drainage amount:  Moderate   Wound treatment:  Wound left open   Packing materials:  None Post-procedure details:    Patient tolerance of procedure:  Tolerated well, no immediate complications   (including critical care time)  Medications Ordered in ED Medications  lidocaine-EPINEPHrine (XYLOCAINE W/EPI) 2 %-1:200000 (PF) injection 10 mL (10 mLs Infiltration Given by Other 09/15/18 2045)     Initial Impression / Assessment and Plan / ED Course  I have reviewed the triage vital signs and the nursing notes.  Pertinent labs & imaging results that were available during my care of the patient were reviewed by me and considered in  my medical decision making (see chart for details).     23 y.o. male here with abscess to the right cheek is been present since 9 days ago but worse over the last 3 days.  On exam, ~2cm circular fluctuant abscess to R cheek/lower jaw, with mild overlying erythema and warmth, mild TTP, ears clear bilaterally, dentition unremarkable with no gum swelling or drainage.  Appears to be a superficial abscess.  It looks like there is an ingrown hair in the center of the abscess.  Suspect this is the source of what caused this.  Given that it is on  the face, will avoid using scalpel, will use 18-gauge needle to aspirate the abscess, and then likely send home with antibiotics.  Will reassess once lidocaine is provided.  9:04 PM Needle aspiration performed and successful, moderate amount of purulent material aspirated, left small hole at the site of the 18G needle insertion. Will send home with abx, advised other OTC remedies for symptomatic relief, and f/up with PCP in 2-3 days for recheck. I explained the diagnosis and have given explicit precautions to return to the ER including for any other new or worsening symptoms. The patient understands and accepts the medical plan as it's been dictated and I have answered their questions. Discharge instructions concerning home care and prescriptions have been given. The patient is STABLE and is discharged to home in good condition.    Final Clinical Impressions(s) / ED Diagnoses   Final diagnoses:  Facial abscess    ED Discharge Orders         Ordered    doxycycline (VIBRAMYCIN) 100 MG capsule  2 times daily     09/15/18 [address] [place], [place] 09/15/18 2104    Terrilee Files, MD 09/16/18 1120

## 2019-04-05 ENCOUNTER — Emergency Department (HOSPITAL_COMMUNITY)
Admission: EM | Admit: 2019-04-05 | Discharge: 2019-04-05 | Payer: Self-pay | Attending: Emergency Medicine | Admitting: Emergency Medicine

## 2019-04-05 ENCOUNTER — Encounter (HOSPITAL_COMMUNITY): Payer: Self-pay | Admitting: *Deleted

## 2019-04-05 ENCOUNTER — Emergency Department (HOSPITAL_COMMUNITY): Payer: Self-pay

## 2019-04-05 ENCOUNTER — Other Ambulatory Visit: Payer: Self-pay

## 2019-04-05 DIAGNOSIS — Y929 Unspecified place or not applicable: Secondary | ICD-10-CM | POA: Insufficient documentation

## 2019-04-05 DIAGNOSIS — F17210 Nicotine dependence, cigarettes, uncomplicated: Secondary | ICD-10-CM | POA: Insufficient documentation

## 2019-04-05 DIAGNOSIS — W19XXXA Unspecified fall, initial encounter: Secondary | ICD-10-CM

## 2019-04-05 DIAGNOSIS — T148XXA Other injury of unspecified body region, initial encounter: Secondary | ICD-10-CM

## 2019-04-05 DIAGNOSIS — G44309 Post-traumatic headache, unspecified, not intractable: Secondary | ICD-10-CM | POA: Insufficient documentation

## 2019-04-05 DIAGNOSIS — W010XXA Fall on same level from slipping, tripping and stumbling without subsequent striking against object, initial encounter: Secondary | ICD-10-CM | POA: Insufficient documentation

## 2019-04-05 DIAGNOSIS — Y9302 Activity, running: Secondary | ICD-10-CM | POA: Insufficient documentation

## 2019-04-05 DIAGNOSIS — S0083XA Contusion of other part of head, initial encounter: Secondary | ICD-10-CM

## 2019-04-05 DIAGNOSIS — Y999 Unspecified external cause status: Secondary | ICD-10-CM | POA: Insufficient documentation

## 2019-04-05 MED ORDER — IBUPROFEN 400 MG PO TABS: 600.0000 mg | ORAL_TABLET | Freq: Once | ORAL | Status: DC | PRN

## 2019-04-05 NOTE — ED Triage Notes (Signed)
Pt was running from [employer], jumped over two bushes, fell on his head. Abrasion to Head, shoulder, and arm. C/o dizziness, EKG showing depression.   Bp 110/70, resp 18, 95% RA, CBG 10120gIV R forearm. Pt handcuffed, in [employer] custody

## 2019-04-05 NOTE — Discharge Instructions (Signed)
Thank you for allowing me to care for you today in the Emergency Department.   Keep your wounds clean by gently washing the area with warm water and soap at least once daily.  Apply a topical antibiotic such as bacitracin or Neosporin to the wounds to prevent infection.  Make sure to keep the areas clean and dry until the wounds heal.  Take 650 mg of Tylenol or 600 mg of ibuprofen with food every 6 hours for pain.  You can alternate between these 2 medications every 3 hours if your pain returns.  For instance, you can take Tylenol at noon, followed by a dose of ibuprofen at 3, followed by second dose of Tylenol and 6.  Return to the emergency department if you develop fever, chills, if the wounds get red, hot to the touch, develop thick, mucus-like drainage, if you have new numbness or weakness, if your fingertips turn blue, or if you develop a severe headache with persistent vomiting, or other new, concerning symptoms.

## 2019-04-05 NOTE — ED Provider Notes (Signed)
[facility] Provider Note   CSN: [id number] Arrival date & time: 04/05/19  0036     History   Chief Complaint Chief Complaint  Patient presents with  . Fall    HPI Ledora BottcherChristopher Challis is a 23 y.o. male with a pertinent past medical history who presents to the emergency department with a chief complaint of fall.  The patient states that he was running from police when he jumped over a bush.  He states that he initially landed on his left leg and then hit his head on concrete.  He reports an associated headache and dizziness that began after the fall.  He was able to get up and was ambulatory after the fall.    He is also endorsing some left shoulder pain.  No syncope, nausea, vomiting, chest pain, shortness of breath, numbness, weakness, visual changes, or facial pain or swelling.  No treatment prior to arrival.  He endorses marijuana use.  No alcohol use.  No chronic medical problems or daily medications.  Tdap was last updated 2 years ago.     The history is provided by the patient. No language interpreter was used.  Fall Associated symptoms include headaches. Pertinent negatives include no chest pain, no abdominal pain and no shortness of breath.    Past Medical History:  Diagnosis Date  . Chlamydia     There are no active problems to display for this patient.   History reviewed. No pertinent surgical history.      Home Medications    Prior to Admission medications   Medication Sig Start Date End Date Taking? Authorizing Provider  doxycycline (VIBRAMYCIN) 100 MG capsule Take 1 capsule (100 mg total) by mouth 2 (two) times daily. One po bid x 7 days 09/15/18   Street, WestonMercedes, PA-C  ondansetron (ZOFRAN ODT) 4 MG disintegrating tablet Take 1 tablet (4 mg total) by mouth every 8 (eight) hours as needed for nausea or vomiting. 01/31/18   Joy, Hillard DankerShawn C, PA-C    Family History No family history on file.  Social History Social History    Tobacco Use  . Smoking status: Current Every Day Smoker    Types: Cigars  . Smokeless tobacco: Never Used  Substance Use Topics  . Alcohol use: Yes    Comment: occassional  . Drug use: Yes    Types: Marijuana     Allergies   Patient has no known allergies.   Review of Systems Review of Systems  Constitutional: Negative for appetite change and fever.  Respiratory: Negative for shortness of breath.   Cardiovascular: Negative for chest pain.  Gastrointestinal: Negative for abdominal pain.  Genitourinary: Negative for dysuria.  Musculoskeletal: Positive for arthralgias and myalgias. Negative for back pain.  Skin: Negative for rash.  Allergic/Immunologic: Negative for immunocompromised state.  Neurological: Positive for dizziness and headaches. Negative for syncope, weakness and light-headedness.  Psychiatric/Behavioral: Negative for confusion.   Physical Exam Updated Vital Signs BP 120/60   Pulse 81   Temp 98 F (36.7 C)   Resp 19   SpO2 100%   Physical Exam Vitals signs and nursing note reviewed.  Constitutional:      Appearance: He is well-developed.  HENT:     Head: Normocephalic.  Eyes:     Conjunctiva/sclera: Conjunctivae normal.  Neck:     Musculoskeletal: Neck supple.  Cardiovascular:     Rate and Rhythm: Normal rate and regular rhythm.     Heart sounds: No murmur.  Pulmonary:  Effort: Pulmonary effort is normal. No respiratory distress.     Breath sounds: No stridor. No wheezing, rhonchi or rales.     Comments: No tenderness palpation over the bilateral ribs.  Lungs are clear to auscultation bilaterally with clear and equal breath sounds in all fields. Chest:     Chest wall: No tenderness.  Abdominal:     General: There is no distension.     Palpations: Abdomen is soft. There is no mass.     Tenderness: There is no abdominal tenderness. There is no right CVA tenderness, left CVA tenderness, guarding or rebound.     Hernia: No hernia is present.   Musculoskeletal:     Comments: Tender to palpation over the left hip.  Pain is localized to the area of a large superficial abrasion.  He also has localized pain to the posterior left shoulder to the area of a large superficial abrasion.  Range of motion of the left wrist is deferred as the patient is currently in handcuffs.  No tenderness palpation of the left wrist.  He is tender to palpation over the left elbow diffusely.  No focal tenderness palpation to the left shoulder or left clavicle.  Skin:    General: Skin is warm and dry.     Comments: There is a large superficial abrasion noted to the left posterior shoulder and over the lateral aspect of the left hip.  There are also multiple superficial abrasions over the left elbow.  There is a hematoma over the left forehead with some overlying abrasions.  Wound is hemostatic.  Neurological:     Mental Status: He is alert.     Comments: 5-5 strength against resistance of the bilateral lower extremities.  Moves all 4 extremities spontaneously.  Sensation is intact and equal throughout.  Psychiatric:        Behavior: Behavior normal.      ED Treatments / Results  Labs (all labs ordered are listed, but only abnormal results are displayed) Labs Reviewed - No data to display  EKG None  Radiology Ct Head Wo Contrast  Result Date: 04/05/2019 CLINICAL DATA:  Posttraumatic headache. Fall striking head on pavement. EXAM: CT HEAD WITHOUT CONTRAST TECHNIQUE: Contiguous axial images were obtained from the base of the skull through the vertex without intravenous contrast. COMPARISON:  None. FINDINGS: Brain: No evidence of acute infarction, hemorrhage, hydrocephalus, extra-axial collection or mass lesion/mass effect. Vascular: No hyperdense vessel or unexpected calcification. Skull: No fracture or focal lesion. Sinuses/Orbits: Paranasal sinuses and mastoid air cells are clear. The visualized orbits are unremarkable. Other: Mild right frontal scalp  contusion. IMPRESSION: Mild right frontal scalp contusion. No acute intracranial abnormality. No skull fracture. Electronically Signed   By: Keith Rake M.D.   On: 04/05/2019 01:40    Procedures Procedures (including critical care time)  Medications Ordered in ED Medications  ibuprofen (ADVIL) tablet 600 mg (has no administration in time range)     Initial Impression / Assessment and Plan / ED Course  I have reviewed the triage vital signs and the nursing notes.  Pertinent labs & imaging results that were available during my care of the patient were reviewed by me and considered in my medical decision making (see chart for details).        23 year old male with history of marijuana use presenting with a chief complaint of fall.  He jumped over 2 bushes and landed on his left leg and then hit his head on concrete.  He initially  endorsed some dizziness that began after he hit his head, but reports this is gradually been improving.  No syncope, nausea, or vomiting.  He has been hemodynamically stable since arrival in the ER.  On exam, he has a hematoma to the left forehead with an overlying superficial abrasion.  The wound is not amenable to laceration repair.  Offered the patient Dermabond, but he declined.  CT head is unremarkable.  Tdap is up-to-date.  The patient also had large abrasions over the left elbow, left hip, left posterior shoulder.  No lacerations amenable to repair.  Although I had a low suspicion for fracture for these areas as he had full range of motion without pain, imaging was offered, which he declined.  The patient was ambulated in the ER and dizziness had resolved.  He was given ibuprofen for pain.  He has no additional complaints at this time.  He is hemodynamically stable and in no acute distress.  ER return precautions given.  Safe for discharge with outpatient follow-up as needed.  Final Clinical Impressions(s) / ED Diagnoses   Final diagnoses:  Fall,  initial encounter  Traumatic hematoma of forehead, initial encounter  Superficial abrasion    ED Discharge Orders    None       Barkley Boards, PA-C 04/05/19 0257    Mesner, Barbara Cower, MD 04/05/19 [phone]

## 2019-04-05 NOTE — ED Notes (Addendum)
PT was able to ambulate in the hallway, to the bathroom, and back to the stretcher unassisted and without additional difficulties.

## 2019-12-30 IMAGING — CT CT HEAD W/O CM
4 series · 16 of 47 positions shown, 18 images · non-contrast
Comparison: None.

CLINICAL DATA: Posttraumatic headache. Fall striking head on
pavement.

EXAM:
CT HEAD WITHOUT CONTRAST
TECHNIQUE: Contiguous axial images were obtained from the base of the skull
through the vertex without intravenous contrast.

[Series 3: head without · axial · non-contrast · 0.44mm/px · z∈[-128,-8]mm · 7 of 34 slices shown, 9 images]
[im 5/34  brain]
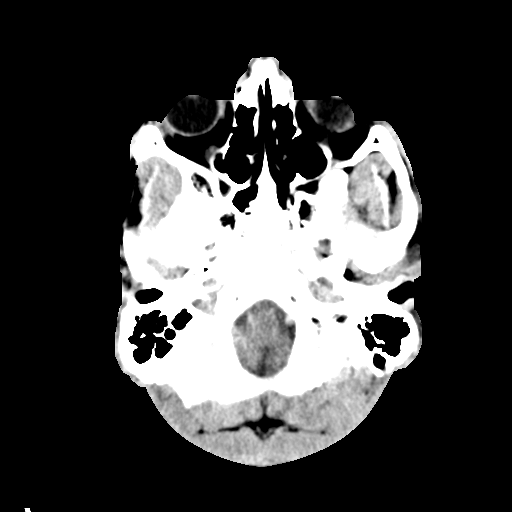
[im 5/34  bone]
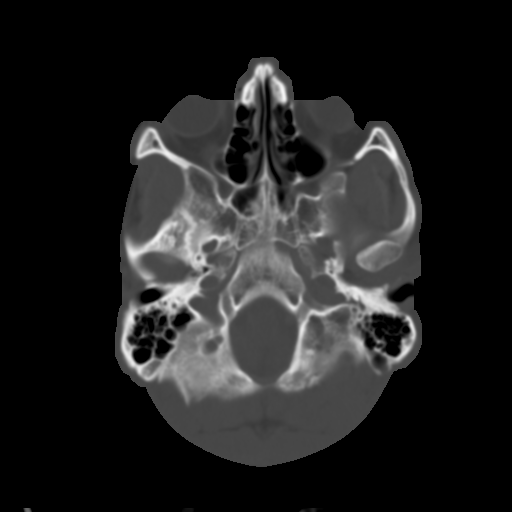
[im 9/34  brain]
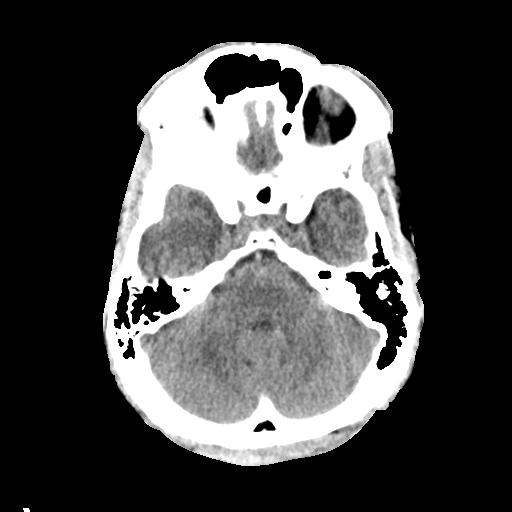
[im 13/34  brain]
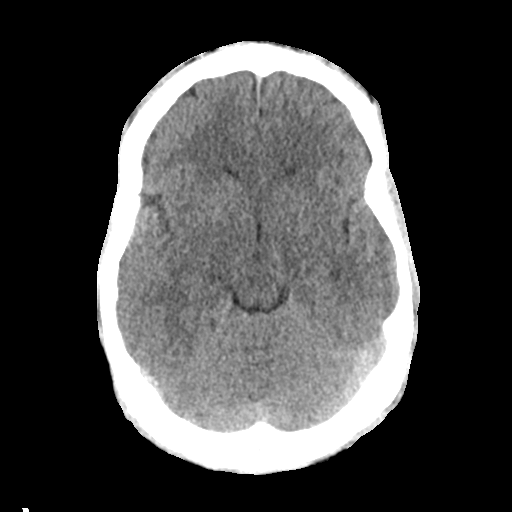
[im 17/34  brain]
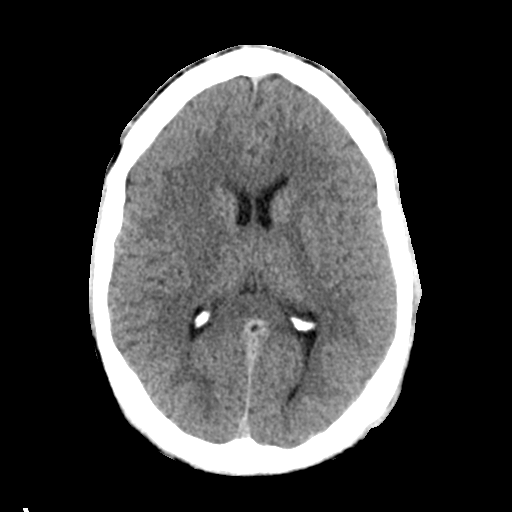
[im 21/34  brain]
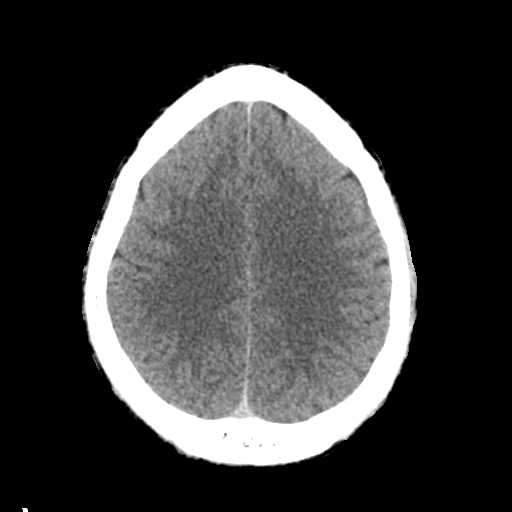
[im 21/34  bone]
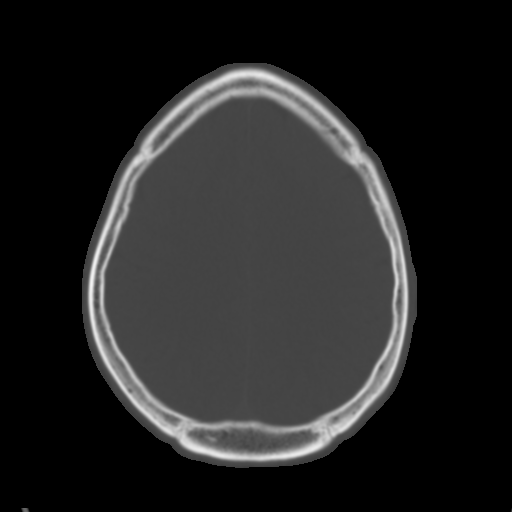
[im 25/34  brain]
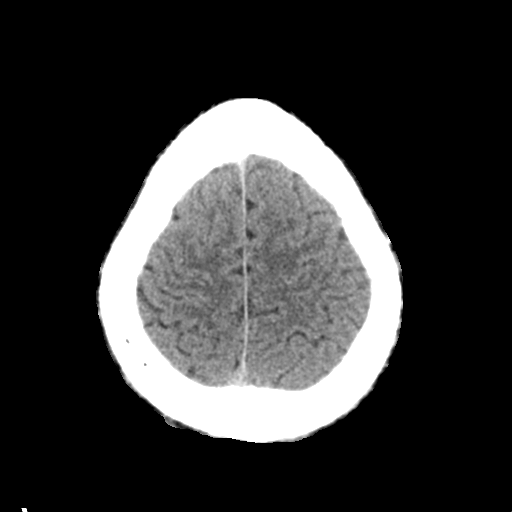
[im 29/34  brain]
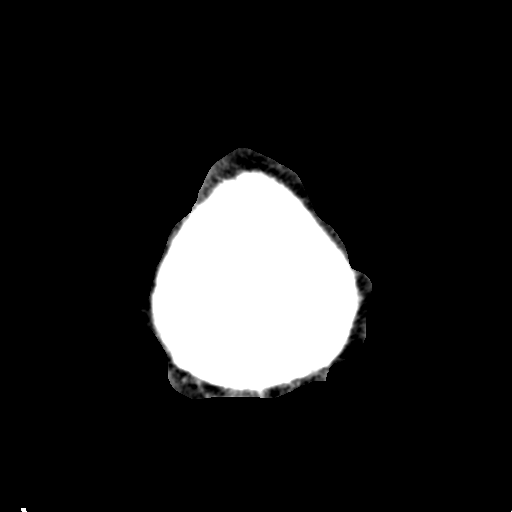

[Series 4: head bone · axial · 0.44mm/px · z∈[-132,-98]mm · 3 of 85 slices shown]
[im 9/85  bone]
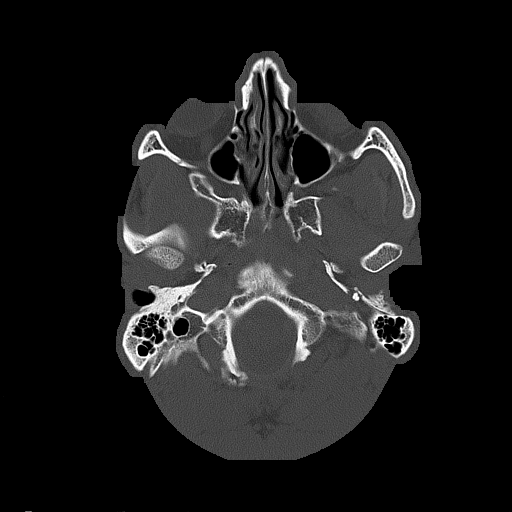
[im 17/85  bone]
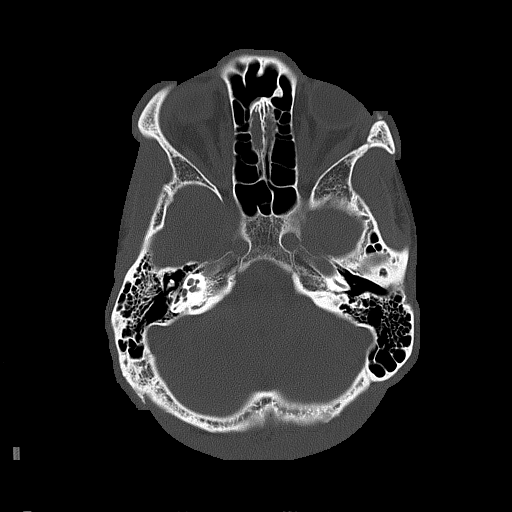
[im 26/85  bone]
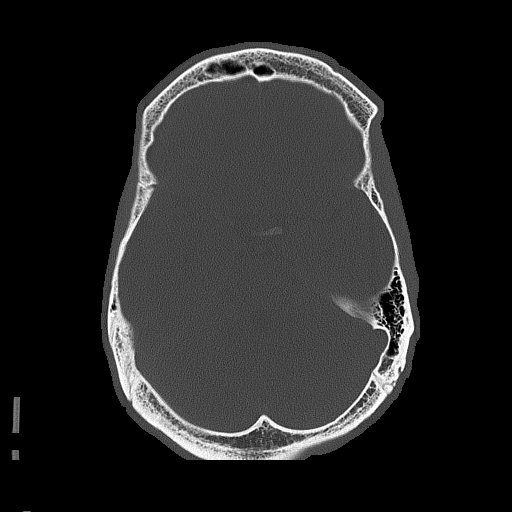

[Series 5: head without cor · coronal · non-contrast · 0.33mm/px · 3 of 71 slices shown]
[im 24/71  brain]
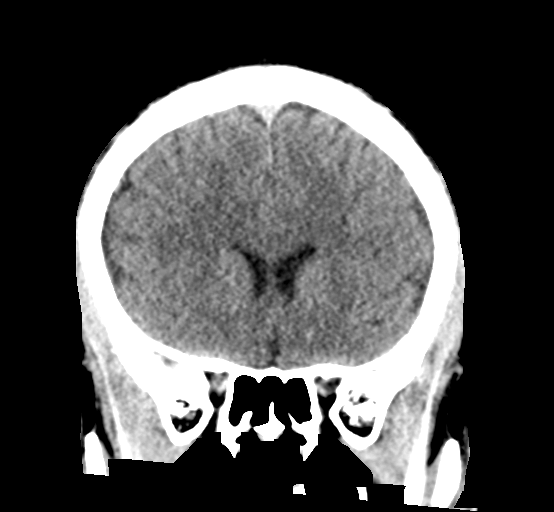
[im 32/71  brain]
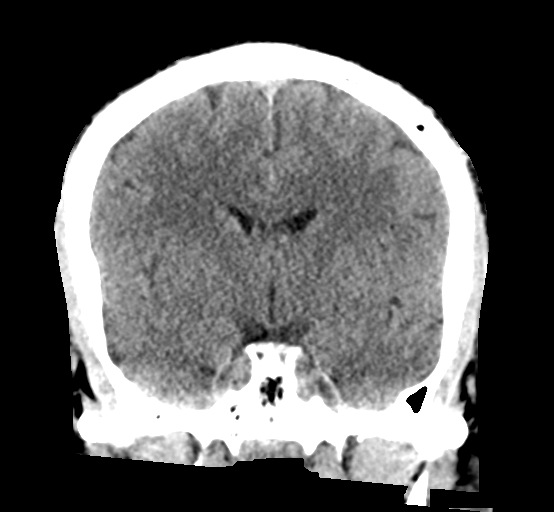
[im 39/71  brain]
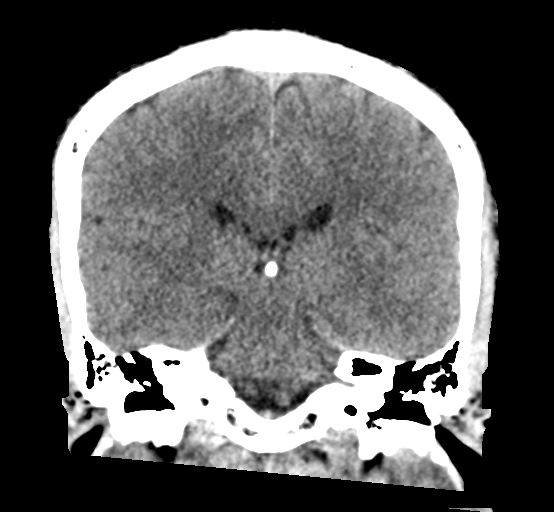

[Series 6: head without sag · sagittal · non-contrast · 0.33mm/px · 3 of 59 slices shown]
[im 20/59  brain]
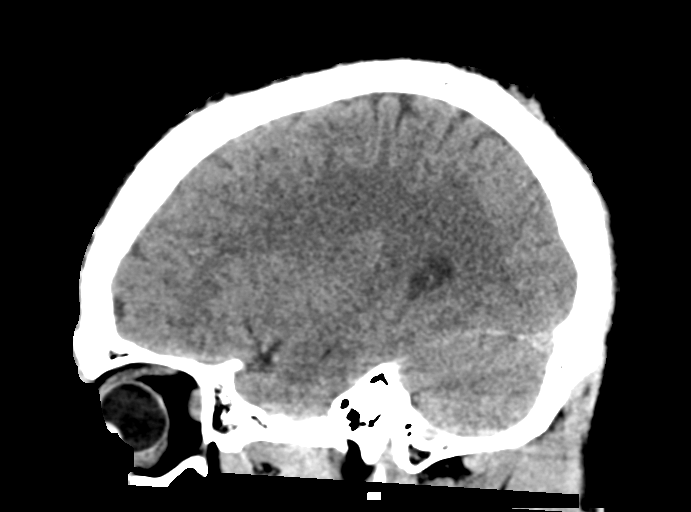
[im 30/59  brain]
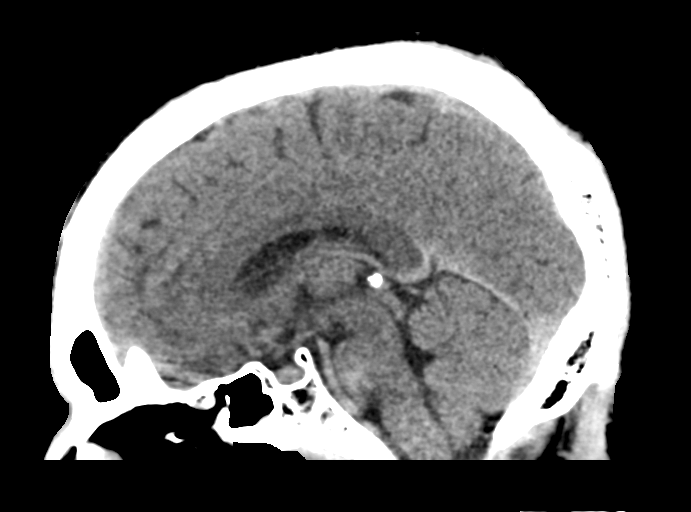
[im 39/59  brain]
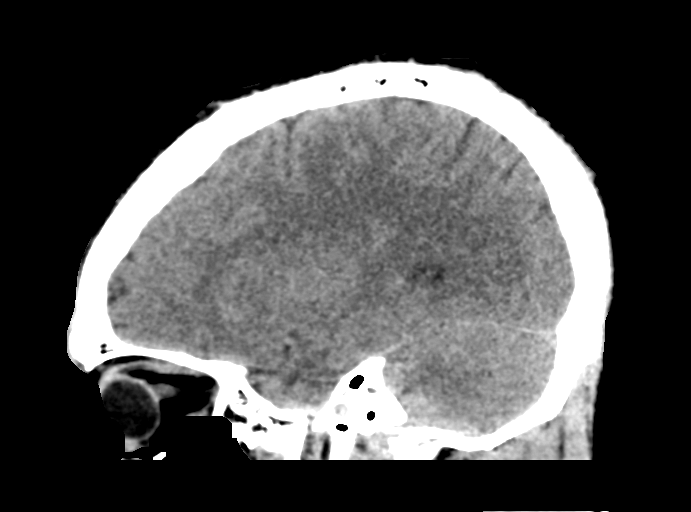

[16 of 47 positions shown; findings below may reference images not displayed]

FINDINGS: Brain: No evidence of acute infarction, hemorrhage, hydrocephalus,
extra-axial collection or mass lesion/mass effect.

Vascular: No hyperdense vessel or unexpected calcification.

Skull: No fracture or focal lesion.

Sinuses/Orbits: Paranasal sinuses and mastoid air cells are clear.
The visualized orbits are unremarkable.

Other: Mild right frontal scalp contusion.
IMPRESSION: Mild right frontal scalp contusion. No acute intracranial
abnormality. No skull fracture.

## 2020-03-02 ENCOUNTER — Other Ambulatory Visit: Payer: Self-pay

## 2020-03-02 ENCOUNTER — Ambulatory Visit (HOSPITAL_COMMUNITY)
Admission: EM | Admit: 2020-03-02 | Discharge: 2020-03-02 | Disposition: A | Discharge status: Home / Self Care | Payer: Self-pay | Attending: Family Medicine | Admitting: Family Medicine

## 2020-03-02 ENCOUNTER — Encounter (HOSPITAL_COMMUNITY): Payer: Self-pay | Admitting: Emergency Medicine

## 2020-03-02 DIAGNOSIS — Z202 Contact with and (suspected) exposure to infections with a predominantly sexual mode of transmission: Secondary | ICD-10-CM | POA: Insufficient documentation

## 2020-03-02 DIAGNOSIS — A630 Anogenital (venereal) warts: Secondary | ICD-10-CM | POA: Insufficient documentation

## 2020-03-02 MED ORDER — ONDANSETRON 4 MG PO TBDP
ORAL_TABLET | ORAL | Status: AC
  Filled 2020-03-02: qty 1

## 2020-03-02 MED ORDER — METRONIDAZOLE 500 MG PO TABS
2000.0000 mg | ORAL_TABLET | Freq: Once | ORAL | Status: AC
  Administered 2020-03-02: 2000 mg via ORAL

## 2020-03-02 MED ORDER — ONDANSETRON 4 MG PO TBDP
4.0000 mg | ORAL_TABLET | Freq: Once | ORAL | Status: AC
  Administered 2020-03-02: 4 mg via ORAL

## 2020-03-02 MED ORDER — METRONIDAZOLE 500 MG PO TABS
ORAL_TABLET | ORAL | Status: AC
  Filled 2020-03-02: qty 4

## 2020-03-02 NOTE — Discharge Instructions (Signed)
We have treated you today for trichomonas.  We will notify of you any positive findings from your penile swab, or if any changes to treatment are needed. If normal or otherwise without concern to your results, we will not call you. Please log on to your MyChart to review your results if interested in so.   I am concerned about genital warts, I have referred you to [facility] to assess for removal consideration Please withhold from intercourse for the next week. Please use condoms to prevent STD's and spread of warts, potentially

## 2020-03-02 NOTE — ED Provider Notes (Signed)
[facility]    CSN: [id number] Arrival date & time: 03/02/20  1541      History   Chief Complaint Chief Complaint  Patient presents with   Exposure to STD    HPI Nathaniel Peterson is a 24 y.o. male.   Ignatz Deis presents with concerns about exposure to trichomonas. His male partner tested positive last week. He doesn't use condoms. Denies any symptoms. Has had stds in the past. Also concerned about "moles" to penile shaft. Non tender. No redness swelling or drainage.    ROS per HPI, negative if not otherwise mentioned.      Past Medical History:  Diagnosis Date   Chlamydia     There are no problems to display for this patient.   History reviewed. No pertinent surgical history.     Home Medications    Prior to Admission medications   Medication Sig Start Date End Date Taking? Authorizing Provider  doxycycline (VIBRAMYCIN) 100 MG capsule Take 1 capsule (100 mg total) by mouth 2 (two) times daily. One po bid x 7 days 09/15/18   Street, [place], PA-C  ondansetron (ZOFRAN ODT) 4 MG disintegrating tablet Take 1 tablet (4 mg total) by mouth every 8 (eight) hours as needed for nausea or vomiting. 01/31/18   Joy, Hillard Danker, PA-C    Family History History reviewed. No pertinent family history.  Social History Social History   Tobacco Use   Smoking status: Current Every Day Smoker    Types: Cigars   Smokeless tobacco: Never Used  Substance Use Topics   Alcohol use: Yes    Comment: occassional   Drug use: Yes    Types: Marijuana     Allergies   Patient has no known allergies.   Review of Systems Review of Systems   Physical Exam Triage Vital Signs ED Triage Vitals  Enc Vitals Group     BP 03/02/20 1643 (!) 146/62     Pulse Rate 03/02/20 1638 (!) 58     Resp 03/02/20 1638 20     Temp 03/02/20 1638 98.7 F (37.1 C)     Temp Source 03/02/20 1638 Oral     SpO2 03/02/20 1638 100 %     Weight --      Height --      Head  Circumference --      Peak Flow --      Pain Score 03/02/20 1727 0     Pain Loc --      Pain Edu? --      Excl. in GC? --    No data found.  Updated Vital Signs BP (!) 146/62 (BP Location: Right Arm)    Pulse (!) 58    Temp 98.7 F (37.1 C) (Oral)    Resp 20    SpO2 100%   Visual Acuity Right Eye Distance:   Left Eye Distance:   Bilateral Distance:    Right Eye Near:   Left Eye Near:    Bilateral Near:     Physical Exam Exam conducted with a chaperone present.  Constitutional:      Appearance: He is well-developed.  Cardiovascular:     Rate and Rhythm: Normal rate.  Pulmonary:     Effort: Pulmonary effort is normal.  Genitourinary:      Comments: Cluster of raised, skin toned, non tender masses, eraser sized; no redness, no drainage  Skin:    General: Skin is warm and dry.  Neurological:  Mental Status: He is alert and oriented to person, place, and time.      UC Treatments / Results  Labs (all labs ordered are listed, but only abnormal results are displayed) Labs Reviewed  CYTOLOGY, (ORAL, ANAL, URETHRAL) ANCILLARY ONLY    EKG   Radiology No results found.  Procedures Procedures (including critical care time)  Medications Ordered in UC Medications  metroNIDAZOLE (FLAGYL) tablet 2,000 mg (2,000 mg Oral Given 03/02/20 1726)  ondansetron (ZOFRAN-ODT) disintegrating tablet 4 mg (4 mg Oral Given 03/02/20 1726)    Initial Impression / Assessment and Plan / UC Course  I have reviewed the triage vital signs and the nursing notes.  Pertinent labs & imaging results that were available during my care of the patient were reviewed by me and considered in my medical decision making (see chart for details).     Empiric trich treatment provided today with penile cytology collected and pending. Concern about genital warts, encouraged follow up with dermatology for definitive treatment. Safe sex encouraged. Patient verbalized understanding and agreeable to plan.     Final Clinical Impressions(s) / UC Diagnoses   Final diagnoses:  Exposure to trichomonas  Genital warts     Discharge Instructions     We have treated you today for trichomonas.  We will notify of you any positive findings from your penile swab, or if any changes to treatment are needed. If normal or otherwise without concern to your results, we will not call you. Please log on to your MyChart to review your results if interested in so.   I am concerned about genital warts, I have referred you to our [facility] clinic [facility] to assess for removal consideration Please withhold from intercourse for the next week. Please use condoms to prevent STD's and spread of warts, potentially    ED Prescriptions    None     PDMP not reviewed this encounter.   Georgetta Haber, NP 03/02/20 1743

## 2020-03-02 NOTE — ED Triage Notes (Signed)
Pt states his girlfriend tested positive for trichomoniasis this past Friday. Pt is asymptomatic.

## 2020-03-03 LAB — CYTOLOGY, (ORAL, ANAL, URETHRAL) ANCILLARY ONLY
Chlamydia: NEGATIVE
Comment: NEGATIVE
Comment: NEGATIVE
Comment: NORMAL
Neisseria Gonorrhea: NEGATIVE
Trichomonas: NEGATIVE

## 2020-03-19 ENCOUNTER — Ambulatory Visit (INDEPENDENT_AMBULATORY_CARE_PROVIDER_SITE_OTHER): Payer: Self-pay | Admitting: Family Medicine

## 2020-03-19 ENCOUNTER — Other Ambulatory Visit: Payer: Self-pay

## 2020-03-19 VITALS — BP 125/82 | HR 69 | Wt 170.4 lb

## 2020-03-19 DIAGNOSIS — Z7689 Persons encountering health services in other specified circumstances: Secondary | ICD-10-CM

## 2020-03-19 DIAGNOSIS — A630 Anogenital (venereal) warts: Secondary | ICD-10-CM | POA: Insufficient documentation

## 2020-03-19 MED ORDER — IMIQUIMOD 5 % EX CREA: TOPICAL_CREAM | CUTANEOUS | 0 refills | Status: AC

## 2020-03-19 NOTE — Assessment & Plan Note (Signed)
-  awaiting HIV screen and RPR results -Prescribed imiquimod topical agent, instructed to take 3x/week. Given enough supply for 4 weeks, patient told that he can contact us if he needs to continue treatment for more than 4 weeks. -Encouraged to use physical barrier protection when sexually active.  -Follow up with PCP

## 2020-03-19 NOTE — Progress Notes (Addendum)
    SUBJECTIVE:   CHIEF COMPLAINT / HPI:   Genital warts Patient presents to the clinic with genital warts that started about 2 months ago. Denies any changes in size or color changes. Endorses being sexual activity with only females. Admits that his recent partner is sexually active with multiple other partners. Presented to the ED for this on 03/02/2020, tested negative for trichomonas, chlamydia and gonorrhea at this time. He states that he has never experienced anything like this before. States that his current sexual partner may have trichomonas and has not been taking her medication. Denies fever and chills. Denies any discharge. Negative for all other ROS.     OBJECTIVE:   BP 125/82   Pulse 69   Wt 170 lb 6.4 oz (77.3 kg)   SpO2 99%   BMI 23.77 kg/m   General: Patient well-appearing, in no acute distress. Resp: no increased work of breathing noted Derm: skin warm and dry to touch, no rashes noted GU: about 10 skin-colored genital wart lesions on the mid-left shaft of the penis with 7-8 of them clustered together, no surrounding erythema or rash noted, no evidence of pus Psych: mood appropriate  ASSESSMENT/PLAN:   Genital warts -awaiting HIV screen and RPR results -Prescribed imiquimod topical agent, instructed to take 3x/week. Given enough supply for 4 weeks, patient told that he can contact us if he needs to continue treatment for more than 4 weeks. -Encouraged to use physical barrier protection when sexually active.  -Follow up with PCP     Reece Leader, DO [facility]   I was present during relevant history, physical and agree with above plan [facility]

## 2020-03-19 NOTE — Patient Instructions (Addendum)
We have prescribed imiquimod, please apply this to the affected area 3 times a week.   Imiquimod skin cream What is this medicine? IMIQUIMOD (i mi KWI mod) cream is used to treat external genital or anal warts. It is also used to treat other skin conditions such as actinic keratosis and certain types of skin cancer. This medicine may be used for other purposes; ask your health care provider or pharmacist if you have questions. COMMON BRAND NAME(S): Celesta Aver What should I tell my health care provider before I take this medicine? They need to know if you have any of these conditions:  decreased immune function  an unusual or allergic reaction to imiquimod, other medicines, foods, dyes, or preservatives  pregnant or trying to get pregnant  breast-feeding How should I use this medicine? This medicine is for external use only. Do not take by mouth. Follow the directions on the prescription label. Apply just before bedtime. Wash your hands before and after use. Apply a thin layer of cream and massage gently into the affected areas until no longer visible. Do not use in the mouth, eyes or the vagina. Use this medicine only on the affected area as directed by your health care provider. Do not use for longer than prescribed. It is important not to use more medicine than prescribed. To do so may increase the chance of side effects. Talk to your pediatrician regarding the use of this medicine in children. While this drug may be prescribed for children as young as 49 years of age for selected conditions, precautions do apply. Overdosage: If you think you have taken too much of this medicine contact a poison control center or emergency room at once. NOTE: This medicine is only for you. Do not share this medicine with others. What if I miss a dose? If you miss a dose, use it as soon as you can. If it is almost time for your next dose, use only that dose. Do not use double or extra doses. What may  interact with this medicine? Interactions are not expected. Do not use any other medicines on the treated area without asking your doctor or health care professional. This list may not describe all possible interactions. Give your health care provider a list of all the medicines, herbs, non-prescription drugs, or dietary supplements you use. Also tell them if you smoke, drink alcohol, or use illegal drugs. Some items may interact with your medicine. What should I watch for while using this medicine? Visit your health care professional for regular checks on your progress. Do not use this medicine until the skin has healed from any other drug (example: podofilox or podophyllin resin) or surgical skin treatment. Females should receive regular pelvic exams while being treated for genital warts. Most patients see improvement within 4 weeks. It may take up to 16 weeks to see a full clearing of the warts. This medicine is not a cure. New warts may develop during or after treatment. Avoid sexual (genital, anal, oral) contact while the cream is on the skin. If warts are visible in the genital area, sexual contact should be avoided until the warts are treated. The use of latex condoms during sexual contact may reduce, but not entirely prevent, infecting others. This medicine may weaken condoms, diaphragms, cervical caps or other barrier devices and make them less effective as birth control. Do not cover the treated area with an airtight bandage. Cotton gauze dressings can be used. Cotton underwear can be worn after  using this medicine on the genital or anal area. Actinic keratoses that were not seen before may appear during treatment and may later go away. The treatment area and surrounding area may lighten or darken after treatment with this medicine. These skin color changes may be permanent in some patients. If you experience a skin reaction at the treatment site that interferes or prevents you from doing any daily  activity, contact your health care provider. You may need a rest period from treatment. Treatment may be restarted once the reaction has gotten better as recommended by your doctor or health care professional. This medicine can make you more sensitive to the sun. Keep out of the sun. If you cannot avoid being in the sun, wear protective clothing and use sunscreen. Do not use sun lamps or tanning beds/booths. What side effects may I notice from receiving this medicine? Side effects that you should report to your doctor or health care professional as soon as possible:  open sores with or without drainage  skin infection  skin rash  unusual or severe skin reaction Side effects that usually do not require medical attention (report to your doctor or health care professional if they continue or are bothersome):  burning or itching  redness of the skin (very common but is usually not painful or harmful)  scabbing, crusting, or peeling skin  skin that becomes hard or thickened  swelling of the skin This list may not describe all possible side effects. Call your doctor for medical advice about side effects. You may report side effects to FDA at 1-800-FDA-1088. Where should I keep my medicine? Keep out of the reach of children. Store between 4 and 25 degrees C (39 and 77 degrees F). Do not freeze. Throw away any unused medicine after the expiration date. Discard packet after applying to affected area. Partial packets should not be saved or reused. NOTE: This sheet is a summary. It may not cover all possible information. If you have questions about this medicine, talk to your doctor, pharmacist, or health care provider.  2020 Elsevier/Gold Standard (2008-07-01 10:33:25)   Genital Warts Genital warts are small growths in the area around the genitals or the anus. They are caused by a type of germ (HPV virus). This germ is spread from person to person during sex. It can be spread through vaginal,  anal, and oral sex. Genital warts can lead to other problems if they are not treated. A person is more likely to have this condition if he or she:  Has sex without using a condom.  Has sex with many people.  Has sex before the age of 70.  Has a weak body defense (immune) system. This condition can be treated with medicines. Your doctor may also burn or freeze the warts. In some cases, surgery may be done to remove the warts. Follow these instructions at home: Medicines   Apply over-the-counter and prescription medicines only as told by your doctor.  Do not use medicines that are meant for treating hand warts.  Talk with your doctor about using creams to treat itching. Instructions for women  Plan to have regular tests to check for cervical cancer. Your risk for this cancer increases when you have genital warts.  If you become pregnant, tell your doctor that you have had genital warts. The germ can be passed to the baby. General instructions  Do not touch or scratch the warts.  Do not have sex until your treatment is done.  Tell  your current and past sexual partners about your condition. They may need treatment.  After treatment, use condoms during sex.  Keep all follow-up visits as told by your doctor. This is important. How is this prevented? Talk with your doctor about getting the HPV shot. The HPV shot:  Can help stop some HPV infections and cancers.  Is given to males and females who are 80-77 years old.  Will not work if you already have HPV.  Is not recommended for pregnant women. Contact a doctor if:  You have redness, swelling, or pain in the area of the treated skin.  You have a fever.  You feel sick.  You feel lumps in the area around your genitals or anus.  You have bleeding in the area around your genitals or anus.  You have pain during sex. Summary  Genital warts are small growths in the areas around the genitals or the anus. They are caused  by a type of germ (HPV virus).  The germ is spread by having vaginal, anal, or oral sex without using a condom.  This condition is treated using medicines. In some cases, freezing, burning, or surgery may be done to get rid of the warts.  This condition may be prevented by getting a HPV shot. This information is not intended to replace advice given to you by your health care provider. Make sure you discuss any questions you have with your health care provider. Document Revised: 08/22/2017 Document Reviewed: 08/22/2017 Elsevier Patient Education  2020 [employer].

## 2020-03-20 LAB — HIV ANTIBODY (ROUTINE TESTING W REFLEX): HIV Screen 4th Generation wRfx: NONREACTIVE

## 2020-03-20 LAB — RPR: RPR Ser Ql: NONREACTIVE

## 2021-04-04 ENCOUNTER — Encounter (HOSPITAL_COMMUNITY): Payer: Self-pay | Admitting: Emergency Medicine

## 2021-04-04 ENCOUNTER — Emergency Department (HOSPITAL_COMMUNITY)
Admission: EM | Admit: 2021-04-04 | Discharge: 2021-04-04 | Disposition: A | Discharge status: Home / Self Care | Payer: Self-pay | Attending: Emergency Medicine | Admitting: Emergency Medicine

## 2021-04-04 DIAGNOSIS — L0291 Cutaneous abscess, unspecified: Secondary | ICD-10-CM

## 2021-04-04 DIAGNOSIS — L0231 Cutaneous abscess of buttock: Secondary | ICD-10-CM | POA: Insufficient documentation

## 2021-04-04 DIAGNOSIS — F17290 Nicotine dependence, other tobacco product, uncomplicated: Secondary | ICD-10-CM | POA: Insufficient documentation

## 2021-04-04 MED ORDER — OXYCODONE-ACETAMINOPHEN 5-325 MG PO TABS
1.0000 | ORAL_TABLET | Freq: Once | ORAL | Status: AC
  Administered 2021-04-04: 1 via ORAL
  Filled 2021-04-04: qty 1

## 2021-04-04 MED ORDER — LIDOCAINE HCL (PF) 1 % IJ SOLN
10.0000 mL | Freq: Once | INTRAMUSCULAR | Status: DC
  Filled 2021-04-04: qty 10

## 2021-04-04 MED ORDER — SULFAMETHOXAZOLE-TRIMETHOPRIM 800-160 MG PO TABS: 1.0000 | ORAL_TABLET | Freq: Two times a day (BID) | ORAL | 0 refills | Status: AC

## 2021-04-04 NOTE — ED Notes (Signed)
Suture cart outside of pts room.

## 2021-04-04 NOTE — Discharge Instructions (Addendum)
You were seen in the Emergency Department today for an abscess. We performed an I&D (incision and drainage) to remove some of the pus. You should also take antibiotics for the next 7 days. We have sent a prescription to your pharmacy.  Please keep the area clean and dry to the best of your ability.

## 2021-04-04 NOTE — ED Notes (Signed)
MD at bedside performing I&D at this time.

## 2021-04-04 NOTE — ED Triage Notes (Signed)
C/o abscess to L buttocks x 2 weeks.  Denies fever or chills.

## 2021-04-04 NOTE — ED Provider Notes (Signed)
[facility] EMERGENCY DEPARTMENT Provider Note   CSN: [id number] Arrival date & time: 04/04/21  1533    History Chief Complaint  Patient presents with   Abscess    Nathaniel Peterson is a 25 y.o. male presenting with abscess.  Patient reports abscess in his left groin/buttock region. States it has been present for 2-3 weeks. It has intermittently drained on it's own but the abscess itself has not improved despite draining. It is painful, rated 8/10. No fever, chills, or other complaints. He has a history of multiple prior abscesses in various locations that have required I&Ds in the past.     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 No pertinent surgical history.    No family history on file.  Social History   Tobacco Use   Smoking status: Every Day    Types: Cigars   Smokeless tobacco: Never  Substance Use Topics   Alcohol use: Yes    Comment: occassional   Drug use: Yes    Types: Marijuana    Home Medications Prior to Admission medications   Medication Sig Start Date End Date Taking? Authorizing Provider  sulfamethoxazole-trimethoprim (BACTRIM DS) 800-160 MG tablet Take 1 tablet by mouth 2 (two) times daily for 7 days. 04/04/21 04/11/21 Yes Maury Dus, MD  imiquimod Mathis Dad) 5 % cream Apply topically 3 (three) times a week. Apply to affected area 3 times a week. 03/20/20   Ganta, Anupa, DO  ondansetron (ZOFRAN ODT) 4 MG disintegrating tablet Take 1 tablet (4 mg total) by mouth every 8 (eight) hours as needed for nausea or vomiting. 01/31/18   Joy, Shawn C, PA-C    Allergies    Patient has no known allergies.  Review of Systems   Review of Systems  Constitutional:  Negative for chills and fever.  Genitourinary:  Negative for dysuria and testicular pain.  Skin:        Abscess   Physical Exam Updated Vital Signs BP 132/79 (BP  Location: Right Arm)   Pulse 75   Temp 98.6 F (37 C) (Oral)   Resp 14   SpO2 100%   Physical Exam Constitutional:      General: He is not in acute distress. Abdominal:     Palpations: Abdomen is soft.     Tenderness: There is no abdominal tenderness.  Skin:    Comments: 3x1cm area of induration on left inferior-medial buttock that is very tender to palpation. Small amount of purulent material draining spontaneously. There is a second smaller (~0.5cm) area of tenderness and induration immediately adjacent to the scrotum  Neurological:     General: No focal deficit present.     Mental Status: He is alert. Mental status is at baseline.    ED Results / Procedures / Treatments   Labs (all labs ordered are listed, but only abnormal results are displayed) Labs Reviewed - No data to display  EKG None  Radiology No results found.  Procedures .Marland KitchenIncision and Drainage  Date/Time: 04/04/2021 6:52 PM Performed by: Maury Dus, MD Authorized by: Milagros Loll, MD   Consent:    Consent obtained:  Verbal   Consent given by:  Patient   Risks discussed:  Pain, incomplete drainage, bleeding and infection Location:    Type:  Abscess   Location: L inferior medial buttock. Pre-procedure details:    Skin  preparation:  Povidone-iodine Anesthesia:    Anesthesia method:  Local infiltration   Local anesthetic:  Lidocaine 1% w/o epi Procedure type:    Complexity:  Simple Procedure details:    Incision type: linear incision x2 (1cm, 0.5cm)   Drainage:  Serosanguinous and purulent   Drainage amount:  Moderate   Packing materials:  None Post-procedure details:    Procedure completion:  Tolerated   Medications Ordered in ED Medications  lidocaine (PF) (XYLOCAINE) 1 % injection 10 mL (has no administration in time range)  oxyCODONE-acetaminophen (PERCOCET/ROXICET) 5-325 MG per tablet 1 tablet (1 tablet Oral Given 04/04/21 1815)    ED Course  I have reviewed the triage vital  signs and the nursing notes.  Pertinent labs & imaging results that were available during my care of the patient were reviewed by me and considered in my medical decision making (see chart for details).    MDM Rules/Calculators/A&P                         25 year old male presents with abscess to left inferomedial buttock x2-3 weeks. The area has been draining spontaneously but the abscess has persisted and is associated with significant pain.  He is afebrile with normal vitals. On exam he has a 3x1cm area of induration in his left inferomedial buttock that is very tender to palpation. Although it is draining a small amount of purulent material on its own, patient would benefit from I&D given the persistence of this abscess and pain. Given percocet x1 for pain control here.  Bedside I&D performed. 2 incisions were made, 1cm incision followed by a second 0.5cm incision and a moderate amount of purulent/serosanguinous material was expressed.  Patient stable for discharge home. Will give 7 day course of antibiotics (Bactrim) as he has an additional smaller abscess that is forming just adjacent to his scrotum.    Final Clinical Impression(s) / ED Diagnoses Final diagnoses:  Abscess    Rx / DC Orders ED Discharge Orders          Ordered    sulfamethoxazole-trimethoprim (BACTRIM DS) 800-160 MG tablet  2 times daily        04/04/21 1926           Maury Dus, MD PGY-2 [facility]   Maury Dus, MD 04/04/21 2125    Milagros Loll, MD 04/04/21 2252

## 2021-07-03 ENCOUNTER — Encounter (HOSPITAL_COMMUNITY): Payer: Self-pay | Admitting: Emergency Medicine

## 2021-07-03 ENCOUNTER — Emergency Department (HOSPITAL_COMMUNITY)
Admission: EM | Admit: 2021-07-03 | Discharge: 2021-07-03 | Disposition: A | Discharge status: Home / Self Care | Payer: Self-pay | Attending: Student | Admitting: Student

## 2021-07-03 ENCOUNTER — Other Ambulatory Visit: Payer: Self-pay

## 2021-07-03 DIAGNOSIS — J020 Streptococcal pharyngitis: Secondary | ICD-10-CM | POA: Insufficient documentation

## 2021-07-03 DIAGNOSIS — Z20822 Contact with and (suspected) exposure to covid-19: Secondary | ICD-10-CM | POA: Insufficient documentation

## 2021-07-03 DIAGNOSIS — F17290 Nicotine dependence, other tobacco product, uncomplicated: Secondary | ICD-10-CM | POA: Insufficient documentation

## 2021-07-03 LAB — RESP PANEL BY RT-PCR (FLU A&B, COVID) ARPGX2
Influenza A by PCR: NEGATIVE
Influenza B by PCR: NEGATIVE
SARS Coronavirus 2 by RT PCR: NEGATIVE

## 2021-07-03 LAB — GROUP A STREP BY PCR: Group A Strep by PCR: DETECTED — AB

## 2021-07-03 MED ORDER — DEXAMETHASONE SODIUM PHOSPHATE 10 MG/ML IJ SOLN
10.0000 mg | Freq: Once | INTRAMUSCULAR | Status: AC
  Administered 2021-07-03: 10 mg via INTRAMUSCULAR
  Filled 2021-07-03: qty 1

## 2021-07-03 MED ORDER — PENICILLIN G BENZATHINE 1200000 UNIT/2ML IM SUSY
1.2000 10*6.[IU] | PREFILLED_SYRINGE | Freq: Once | INTRAMUSCULAR | Status: AC
  Administered 2021-07-03: 1.2 10*6.[IU] via INTRAMUSCULAR
  Filled 2021-07-03: qty 2

## 2021-07-03 NOTE — ED Provider Notes (Signed)
[facility] EMERGENCY DEPARTMENT Provider Note   CSN: [id number] Arrival date & time: 07/03/21  1029     History Chief Complaint  Patient presents with   Sore Throat    Nathaniel Peterson is a 25 y.o. male.  25 year old male presents with complaint of sore throat x2 weeks, progressively worsening, more so on the left side.  Patient thought that he might have strep, tried treating at home with OTC cold remedies without improvement.  He also reports having left ear pain.  He denies fevers, chills, cough, congestion or body aches.  Was exposed to his girlfriend who had the flu.  No other complaints or concerns.      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 No pertinent surgical history.     No family history on file.  Social History   Tobacco Use   Smoking status: Every Day    Types: Cigars   Smokeless tobacco: Never  Substance Use Topics   Alcohol use: Yes    Comment: occassional   Drug use: Yes    Types: Marijuana    Home Medications Prior to Admission medications   Medication Sig Start Date End Date Taking? Authorizing Provider  imiquimod (ALDARA) 5 % cream Apply topically 3 (three) times a week. Apply to affected area 3 times a week. 03/20/20   Ganta, Anupa, DO  ondansetron (ZOFRAN ODT) 4 MG disintegrating tablet Take 1 tablet (4 mg total) by mouth every 8 (eight) hours as needed for nausea or vomiting. 01/31/18   Joy, Shawn C, PA-C    Allergies    Patient has no known allergies.  Review of Systems   Review of Systems  Constitutional:  Negative for chills and fever.  HENT:  Positive for ear pain, sore throat and voice change. Negative for congestion.   Respiratory:  Negative for cough.   Gastrointestinal:  Negative for nausea and vomiting.  Musculoskeletal:  Negative for arthralgias and myalgias.  Skin:  Negative for rash and  wound.  Allergic/Immunologic: Negative for immunocompromised state.  Neurological:  Negative for headaches.  Hematological:  Positive for adenopathy.  Psychiatric/Behavioral:  Negative for confusion.   All other systems reviewed and are negative.  Physical Exam Updated Vital Signs BP (!) 140/95 (BP Location: Left Arm)   Pulse 63   Temp 97.7 F (36.5 C) (Oral)   Resp 18   SpO2 100%   Physical Exam Vitals and nursing note reviewed.  Constitutional:      General: He is not in acute distress.    Appearance: He is well-developed. He is not diaphoretic.  HENT:     Head: Normocephalic and atraumatic.     Right Ear: Tympanic membrane and ear canal normal.     Left Ear: Tympanic membrane and ear canal normal.     Nose: No congestion.     Mouth/Throat:     Mouth: Mucous membranes are moist.     Pharynx: Uvula midline. No posterior oropharyngeal erythema or uvula swelling.     Tonsils: No tonsillar exudate. 2+ on the right. 2+ on the left.  Eyes:     Conjunctiva/sclera: Conjunctivae normal.  Cardiovascular:     Rate and Rhythm: Normal rate and regular rhythm.     Heart sounds: Normal heart sounds. No murmur heard. Pulmonary:     Effort: Pulmonary effort is normal.  Musculoskeletal:     Cervical back: Neck supple.  Lymphadenopathy:     Cervical: Cervical adenopathy present.     Right cervical: Superficial cervical adenopathy present.     Left cervical: Superficial cervical adenopathy present.  Skin:    General: Skin is warm and dry.  Neurological:     Mental Status: He is alert and oriented to person, place, and time.  Psychiatric:        Behavior: Behavior normal.    ED Results / Procedures / Treatments   Labs (all labs ordered are listed, but only abnormal results are displayed) Labs Reviewed  GROUP A STREP BY PCR - Abnormal; Notable for the following components:      Result Value   Group A Strep by PCR DETECTED (*)    All other components within normal limits  RESP  PANEL BY RT-PCR (FLU A&B, COVID) ARPGX2    EKG None  Radiology No results found.  Procedures Procedures   Medications Ordered in ED Medications  penicillin g benzathine (BICILLIN LA) 1200000 UNIT/2ML injection 1.2 Million Units (has no administration in time range)  dexamethasone (DECADRON) injection 10 mg (has no administration in time range)    ED Course  I have reviewed the triage vital signs and the nursing notes.  Pertinent labs & imaging results that were available during my care of the patient were reviewed by me and considered in my medical decision making (see chart for details).  Clinical Course as of 07/03/21 1131  Sat Jul 03, 2021  3121 25 year old male with sore throat x2 weeks, found to have erythematous, enlarged tonsils bilaterally without evidence of peritonsillar abscess, uvula is midline.  Voice is slightly muffled, he is tolerating his secretions.  Vitals reviewed, he is afebrile with an O2 sat of 100% on room air.  The patient is positive for strep.  Plan is to treat with IM Bicillin and Decadron.  Recommend return to ED for any worsening or concerning symptoms otherwise Home to rest, hydrating fluids, Motrin and Tylenol as needed directed. [LM]    Clinical Course User Index [LM] Alden Hipp   MDM Rules/Calculators/A&P                           Final Clinical Impression(s) / ED Diagnoses Final diagnoses:  Strep pharyngitis    Rx / DC Orders ED Discharge Orders     None        Jeannie Fend, PA-C 07/03/21 1131    Kommor, Sebree, MD 07/03/21 [phone]

## 2021-07-03 NOTE — Discharge Instructions (Signed)
Motrin and Tylenol as needed as directed for pain relief.  Be sure to drink plenty of hydrating fluids such as low sugar Gatorade.  Return to the emergency room for worsening or concerning symptoms. Sure to throw away your toothbrush and use a new toothbrush starting Monday morning to avoid reinfection.

## 2021-07-03 NOTE — ED Triage Notes (Signed)
C/o sore throat x 1 1/2 weeks.  States it feels like a golf ball on L side of throat.  Also having L ear pain, difficulty swallowing, and headache.

## 2021-07-03 NOTE — ED Notes (Signed)
Patient discharge instructions reviewed with the patient. The patient verbalized understanding of instruction. Patient discharged understanding.

## 2022-01-26 ENCOUNTER — Ambulatory Visit (HOSPITAL_COMMUNITY)
Admission: EM | Admit: 2022-01-26 | Discharge: 2022-01-26 | Disposition: A | Discharge status: Home / Self Care | Payer: Self-pay | Attending: Family Medicine | Admitting: Family Medicine

## 2022-01-26 ENCOUNTER — Encounter (HOSPITAL_COMMUNITY): Payer: Self-pay

## 2022-01-26 ENCOUNTER — Ambulatory Visit (INDEPENDENT_AMBULATORY_CARE_PROVIDER_SITE_OTHER): Payer: Self-pay

## 2022-01-26 DIAGNOSIS — M79644 Pain in right finger(s): Secondary | ICD-10-CM

## 2022-01-26 MED ORDER — DICLOFENAC SODIUM 75 MG PO TBEC: 75.0000 mg | DELAYED_RELEASE_TABLET | Freq: Two times a day (BID) | ORAL | 0 refills | Status: AC
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
